# Patient Record
Sex: Female | Born: 1990 | Race: Black or African American | Hispanic: No | Marital: Single | Smoking: Former smoker
Health system: Southern US, Community
[De-identification: ages and names within clinical notes are randomized; demographics above are authoritative.]

## PROBLEM LIST (undated history)

## (undated) DIAGNOSIS — G40909 Epilepsy, unspecified, not intractable, without status epilepticus: Secondary | ICD-10-CM

## (undated) HISTORY — PX: APPENDECTOMY: SHX54

---

## 2014-10-10 ENCOUNTER — Encounter (HOSPITAL_COMMUNITY): Payer: Self-pay | Admitting: Emergency Medicine

## 2014-10-10 ENCOUNTER — Emergency Department (HOSPITAL_COMMUNITY)
Admission: EM | Admit: 2014-10-10 | Discharge: 2014-10-10 | Disposition: A | Discharge status: Home / Self Care | Payer: Self-pay | Attending: Emergency Medicine | Admitting: Emergency Medicine

## 2014-10-10 DIAGNOSIS — Z8669 Personal history of other diseases of the nervous system and sense organs: Secondary | ICD-10-CM | POA: Insufficient documentation

## 2014-10-10 DIAGNOSIS — Y998 Other external cause status: Secondary | ICD-10-CM | POA: Insufficient documentation

## 2014-10-10 DIAGNOSIS — Y9389 Activity, other specified: Secondary | ICD-10-CM | POA: Insufficient documentation

## 2014-10-10 DIAGNOSIS — S3992XA Unspecified injury of lower back, initial encounter: Secondary | ICD-10-CM | POA: Insufficient documentation

## 2014-10-10 DIAGNOSIS — Z3202 Encounter for pregnancy test, result negative: Secondary | ICD-10-CM | POA: Insufficient documentation

## 2014-10-10 DIAGNOSIS — Y92410 Unspecified street and highway as the place of occurrence of the external cause: Secondary | ICD-10-CM | POA: Insufficient documentation

## 2014-10-10 DIAGNOSIS — Z720 Tobacco use: Secondary | ICD-10-CM | POA: Insufficient documentation

## 2014-10-10 DIAGNOSIS — R55 Syncope and collapse: Secondary | ICD-10-CM | POA: Insufficient documentation

## 2014-10-10 HISTORY — DX: Epilepsy, unspecified, not intractable, without status epilepticus: G40.909

## 2014-10-10 LAB — CBC WITH DIFFERENTIAL/PLATELET
BASOS PCT: 0 % (ref 0–1)
Basophils Absolute: 0 10*3/uL (ref 0.0–0.1)
Eosinophils Absolute: 0 10*3/uL (ref 0.0–0.7)
Eosinophils Relative: 0 % (ref 0–5)
HCT: 37.1 % (ref 36.0–46.0)
HEMOGLOBIN: 12.6 g/dL (ref 12.0–15.0)
LYMPHS ABS: 2.5 10*3/uL (ref 0.7–4.0)
LYMPHS PCT: 38 % (ref 12–46)
MCH: 27.8 pg (ref 26.0–34.0)
MCHC: 34 g/dL (ref 30.0–36.0)
MCV: 81.9 fL (ref 78.0–100.0)
MONO ABS: 0.5 10*3/uL (ref 0.1–1.0)
MONOS PCT: 7 % (ref 3–12)
Neutro Abs: 3.5 10*3/uL (ref 1.7–7.7)
Neutrophils Relative %: 55 % (ref 43–77)
Platelets: 210 10*3/uL (ref 150–400)
RBC: 4.53 MIL/uL (ref 3.87–5.11)
RDW: 12.2 % (ref 11.5–15.5)
WBC: 6.4 10*3/uL (ref 4.0–10.5)

## 2014-10-10 LAB — BASIC METABOLIC PANEL
ANION GAP: 9 (ref 5–15)
BUN: 7 mg/dL (ref 6–23)
CALCIUM: 9.2 mg/dL (ref 8.4–10.5)
CO2: 23 mmol/L (ref 19–32)
Chloride: 109 mmol/L (ref 96–112)
Creatinine, Ser: 1.06 mg/dL (ref 0.50–1.10)
GFR calc Af Amer: 86 mL/min — ABNORMAL LOW (ref >90)
GFR, EST NON AFRICAN AMERICAN: 74 mL/min — AB (ref >90)
Glucose, Bld: 96 mg/dL (ref 70–99)
Potassium: 3.4 mmol/L — ABNORMAL LOW (ref 3.5–5.1)
Sodium: 141 mmol/L (ref 135–145)

## 2014-10-10 LAB — POC URINE PREG, ED: PREG TEST UR: NEGATIVE

## 2014-10-10 MED ORDER — SODIUM CHLORIDE 0.9 % IV BOLUS (SEPSIS)
1000.0000 mL | Freq: Once | INTRAVENOUS | Status: AC
  Administered 2014-10-10: 1000 mL via INTRAVENOUS

## 2014-10-10 MED ORDER — HYDROCODONE-ACETAMINOPHEN 5-325 MG PO TABS
1.0000 | ORAL_TABLET | Freq: Once | ORAL | Status: AC
  Administered 2014-10-10: 1 via ORAL
  Filled 2014-10-10: qty 1

## 2014-10-10 NOTE — Discharge Instructions (Signed)
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
## 2018-05-15 ENCOUNTER — Other Ambulatory Visit: Payer: Self-pay

## 2018-05-15 ENCOUNTER — Encounter (HOSPITAL_COMMUNITY): Payer: Self-pay

## 2018-05-15 DIAGNOSIS — F17210 Nicotine dependence, cigarettes, uncomplicated: Secondary | ICD-10-CM | POA: Insufficient documentation

## 2018-05-15 DIAGNOSIS — N3000 Acute cystitis without hematuria: Secondary | ICD-10-CM | POA: Insufficient documentation

## 2018-05-15 NOTE — ED Triage Notes (Signed)
Pt c/o pain and burning with urination x 1 week  with associated back and abdominal pain that started yesterday.

## 2018-05-16 ENCOUNTER — Emergency Department (HOSPITAL_COMMUNITY)
Admission: EM | Admit: 2018-05-16 | Discharge: 2018-05-16 | Disposition: A | Discharge status: Home / Self Care | Payer: Self-pay | Attending: Emergency Medicine | Admitting: Emergency Medicine

## 2018-05-16 DIAGNOSIS — R350 Frequency of micturition: Secondary | ICD-10-CM

## 2018-05-16 DIAGNOSIS — R300 Dysuria: Secondary | ICD-10-CM

## 2018-05-16 DIAGNOSIS — N3000 Acute cystitis without hematuria: Secondary | ICD-10-CM

## 2018-05-16 LAB — PREGNANCY, URINE: Preg Test, Ur: NEGATIVE

## 2018-05-16 LAB — URINALYSIS, ROUTINE W REFLEX MICROSCOPIC
Bilirubin Urine: NEGATIVE
Glucose, UA: NEGATIVE mg/dL
Ketones, ur: NEGATIVE mg/dL
NITRITE: POSITIVE — AB
Protein, ur: NEGATIVE mg/dL
Specific Gravity, Urine: 1.025 (ref 1.005–1.030)
pH: 5 (ref 5.0–8.0)

## 2018-05-16 MED ORDER — PHENAZOPYRIDINE HCL 100 MG PO TABS
100.0000 mg | ORAL_TABLET | Freq: Once | ORAL | Status: AC
  Administered 2018-05-16: 100 mg via ORAL
  Filled 2018-05-16: qty 1

## 2018-05-16 MED ORDER — CEPHALEXIN 500 MG PO CAPS
500.0000 mg | ORAL_CAPSULE | Freq: Once | ORAL | Status: AC
  Administered 2018-05-16: 500 mg via ORAL
  Filled 2018-05-16: qty 1

## 2018-05-16 MED ORDER — PHENAZOPYRIDINE HCL 200 MG PO TABS: 200.0000 mg | ORAL_TABLET | Freq: Three times a day (TID) | ORAL | 0 refills | Status: DC | PRN

## 2018-05-16 MED ORDER — CEPHALEXIN 500 MG PO CAPS: 500.0000 mg | ORAL_CAPSULE | Freq: Two times a day (BID) | ORAL | 0 refills | Status: DC

## 2018-05-16 NOTE — Discharge Instructions (Signed)
Take the prescribed medication as directed.  Make sure to drink plenty of water. Follow-up with your primary care doctor. Return to the ED for new or worsening symptoms. 

## 2018-05-16 NOTE — ED Provider Notes (Signed)
[facility]-EMERGENCY DEPT Provider Note   CSN: [id number] Arrival date & time: 05/15/18  2150     History   Chief Complaint Chief Complaint  Patient presents with  . Urinary Tract Infection    HPI Heather Warner is a 27 y.o. female.  The history is provided by the patient and medical records.     27 year old female with history of epilepsy, presenting to the ED with UTI symptoms.  States this actually began a week ago, initially was some mild dysuria and pressure when urinating but now starting to have some pain in her back and lower abdomen.  She reports continued dysuria and urinary frequency.  She denies any pelvic pain or vaginal discharge.  No fever or chills.  No nausea or vomiting.  States she has had UTIs in the past and this feels similar.  She has not tried any over-the-counter medications for her symptoms.  Past Medical History:  Diagnosis Date  . Epilepsy (HCC)     There are no active problems to display for this patient.   Past Surgical History:  Procedure Laterality Date  . APPENDECTOMY       OB History    Gravida  1   Para      Term      Preterm      AB  1   Living  0     SAB  1   TAB      Ectopic      Multiple      Live Births               Home Medications    Prior to Admission medications   Not on File    Family History History reviewed. No pertinent family history.  Social History Social History   Tobacco Use  . Smoking status: Current Every Day Smoker    Packs/day: 0.50    Types: Cigarettes  Substance Use Topics  . Alcohol use: Yes    Comment: occasionally  . Drug use: Yes    Types: Marijuana    Comment: occasionally     Allergies   Patient has no known allergies.   Review of Systems Review of Systems  Genitourinary: Positive for dysuria and frequency.  All other systems reviewed and are negative.    Physical Exam Updated Vital Signs BP 120/89 (BP Location: Left Arm)    Pulse (!) 55   Temp 98.1 F (36.7 C) (Oral)   Resp 18   Ht 5\' 4"  (1.626 m)   Wt 52.8 kg   LMP 05/08/2018   SpO2 100%   BMI 19.98 kg/m   Physical Exam  Constitutional: She is oriented to person, place, and time. She appears well-developed and well-nourished.  HENT:  Head: Normocephalic and atraumatic.  Mouth/Throat: Oropharynx is clear and moist.  Eyes: Pupils are equal, round, and reactive to light. Conjunctivae and EOM are normal.  Neck: Normal range of motion.  Cardiovascular: Normal rate, regular rhythm and normal heart sounds.  Pulmonary/Chest: Effort normal and breath sounds normal. No stridor. No respiratory distress.  Abdominal: Soft. Bowel sounds are normal. There is no tenderness. There is no rebound.  Musculoskeletal: Normal range of motion.  Neurological: She is alert and oriented to person, place, and time.  Skin: Skin is warm and dry.  Psychiatric: She has a normal mood and affect.  Nursing note and vitals reviewed.    ED Treatments / Results  Labs (all labs ordered are listed, but  only abnormal results are displayed) Labs Reviewed  URINALYSIS, ROUTINE W REFLEX MICROSCOPIC - Abnormal; Notable for the following components:      Result Value   Hgb urine dipstick SMALL (*)    Nitrite POSITIVE (*)    Leukocytes, UA TRACE (*)    Bacteria, UA RARE (*)    All other components within normal limits  PREGNANCY, URINE    EKG None  Radiology No results found.  Procedures Procedures (including critical care time)  Medications Ordered in ED Medications - No data to display   Initial Impression / Assessment and Plan / ED Course  I have reviewed the triage vital signs and the nursing notes.  Pertinent labs & imaging results that were available during my care of the patient were reviewed by me and considered in my medical decision making (see chart for details).  27 year old female here with urinary symptoms.  Has been ongoing for about 1 week.  She reports  dysuria and urinary frequency, now with some lower abdominal pain.  She is afebrile and nontoxic.  Abdomen is soft and benign.  No focal CVA tenderness.  UA appears infectious, nitrite positive.  Will treat with course of Keflex and Pyridium.  Encourage good oral hydration.  Close follow-up with PCP.  Return here for any new or worsening symptoms.  Final Clinical Impressions(s) / ED Diagnoses   Final diagnoses:  Acute cystitis without hematuria  Dysuria  Urinary frequency    ED Discharge Orders         Ordered    cephALEXin (KEFLEX) 500 MG capsule  2 times daily     05/16/18 0250    phenazopyridine (PYRIDIUM) 200 MG tablet  3 times daily PRN     05/16/18 0250           Garlon Hatchet, PA-C 05/16/18 2956    Zadie Rhine, MD 05/16/18 [phone]

## 2018-07-06 ENCOUNTER — Emergency Department (HOSPITAL_COMMUNITY)
Admission: EM | Admit: 2018-07-06 | Discharge: 2018-07-07 | Disposition: A | Discharge status: Home / Self Care | Payer: Self-pay | Attending: Emergency Medicine | Admitting: Emergency Medicine

## 2018-07-06 ENCOUNTER — Other Ambulatory Visit: Payer: Self-pay

## 2018-07-06 ENCOUNTER — Encounter (HOSPITAL_COMMUNITY): Payer: Self-pay

## 2018-07-06 DIAGNOSIS — F1910 Other psychoactive substance abuse, uncomplicated: Secondary | ICD-10-CM | POA: Insufficient documentation

## 2018-07-06 DIAGNOSIS — F17210 Nicotine dependence, cigarettes, uncomplicated: Secondary | ICD-10-CM | POA: Insufficient documentation

## 2018-07-06 DIAGNOSIS — Z79899 Other long term (current) drug therapy: Secondary | ICD-10-CM | POA: Insufficient documentation

## 2018-07-06 DIAGNOSIS — R569 Unspecified convulsions: Secondary | ICD-10-CM

## 2018-07-06 DIAGNOSIS — G40909 Epilepsy, unspecified, not intractable, without status epilepticus: Secondary | ICD-10-CM | POA: Insufficient documentation
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
## 2018-07-06 NOTE — ED Notes (Signed)
Bed: [id number] Expected date:  Expected time:  Means of arrival:  Comments: EMS 27 yo female from home-seizure type activity-leg weakness after seizure-now has a headache
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
## 2018-07-07 ENCOUNTER — Emergency Department (HOSPITAL_COMMUNITY): Payer: Self-pay

## 2018-07-07 LAB — URINALYSIS, ROUTINE W REFLEX MICROSCOPIC
Bilirubin Urine: NEGATIVE
Glucose, UA: NEGATIVE mg/dL
Ketones, ur: 5 mg/dL — AB
Leukocytes, UA: NEGATIVE
Nitrite: NEGATIVE
Protein, ur: 30 mg/dL — AB
SPECIFIC GRAVITY, URINE: 1.025 (ref 1.005–1.030)
pH: 6 (ref 5.0–8.0)

## 2018-07-07 LAB — COMPREHENSIVE METABOLIC PANEL
ALK PHOS: 38 U/L (ref 38–126)
ALT: 13 U/L (ref 0–44)
ANION GAP: 9 (ref 5–15)
AST: 20 U/L (ref 15–41)
Albumin: 3.6 g/dL (ref 3.5–5.0)
BILIRUBIN TOTAL: 0.5 mg/dL (ref 0.3–1.2)
BUN: 10 mg/dL (ref 6–20)
CO2: 23 mmol/L (ref 22–32)
Calcium: 8.9 mg/dL (ref 8.9–10.3)
Chloride: 107 mmol/L (ref 98–111)
Creatinine, Ser: 1.01 mg/dL — ABNORMAL HIGH (ref 0.44–1.00)
GFR calc Af Amer: >60 mL/min (ref >60)
Glucose, Bld: 84 mg/dL (ref 70–99)
Potassium: 3.3 mmol/L — ABNORMAL LOW (ref 3.5–5.1)
Sodium: 139 mmol/L (ref 135–145)
TOTAL PROTEIN: 6.4 g/dL — AB (ref 6.5–8.1)

## 2018-07-07 LAB — CBC WITH DIFFERENTIAL/PLATELET
Abs Immature Granulocytes: 0.04 10*3/uL (ref 0.00–0.07)
Basophils Absolute: 0 10*3/uL (ref 0.0–0.1)
Basophils Relative: 0 %
EOS ABS: 0.1 10*3/uL (ref 0.0–0.5)
EOS PCT: 1 %
HEMATOCRIT: 40.8 % (ref 36.0–46.0)
HEMOGLOBIN: 13.2 g/dL (ref 12.0–15.0)
Immature Granulocytes: 1 %
LYMPHS ABS: 1.6 10*3/uL (ref 0.7–4.0)
Lymphocytes Relative: 21 %
MCH: 28.7 pg (ref 26.0–34.0)
MCHC: 32.4 g/dL (ref 30.0–36.0)
MCV: 88.7 fL (ref 80.0–100.0)
MONO ABS: 0.4 10*3/uL (ref 0.1–1.0)
Monocytes Relative: 5 %
NEUTROS ABS: 5.3 10*3/uL (ref 1.7–7.7)
NEUTROS PCT: 72 %
NRBC: 0 % (ref 0.0–0.2)
Platelets: 231 10*3/uL (ref 150–400)
RBC: 4.6 MIL/uL (ref 3.87–5.11)
RDW: 11.9 % (ref 11.5–15.5)
WBC: 7.5 10*3/uL (ref 4.0–10.5)

## 2018-07-07 LAB — I-STAT BETA HCG BLOOD, ED (MC, WL, AP ONLY)

## 2018-07-07 LAB — RAPID URINE DRUG SCREEN, HOSP PERFORMED
Amphetamines: NOT DETECTED
Barbiturates: NOT DETECTED
Benzodiazepines: NOT DETECTED
COCAINE: POSITIVE — AB
OPIATES: NOT DETECTED
TETRAHYDROCANNABINOL: POSITIVE — AB

## 2018-07-07 MED ORDER — ACETAMINOPHEN 500 MG PO TABS
1000.0000 mg | ORAL_TABLET | Freq: Once | ORAL | Status: AC
  Administered 2018-07-07: 1000 mg via ORAL
  Filled 2018-07-07: qty 2

## 2018-07-07 MED ORDER — LEVETIRACETAM 500 MG PO TABS: 500.0000 mg | ORAL_TABLET | Freq: Two times a day (BID) | ORAL | 1 refills | Status: DC

## 2018-07-07 MED ORDER — LEVETIRACETAM IN NACL 1000 MG/100ML IV SOLN
1000.0000 mg | Freq: Once | INTRAVENOUS | Status: AC
  Administered 2018-07-07: 1000 mg via INTRAVENOUS
  Filled 2018-07-07: qty 100

## 2018-07-07 NOTE — ED Notes (Signed)
Asked for urine  

## 2018-07-07 NOTE — ED Notes (Signed)
Pt ambulated to BR to attempt to give urine specimen.

## 2018-07-07 NOTE — Discharge Instructions (Addendum)
You should not drive until you have been seen and cleared by your neurologist.

## 2019-06-24 IMAGING — CT CT HEAD W/O CM
3 series · 16 of 45 positions shown, 19 images · non-contrast
Comparison: None.

CLINICAL DATA: Seizure, headache

EXAM:
CT HEAD WITHOUT CONTRAST
TECHNIQUE: Contiguous axial images were obtained from the base of the skull
through the vertex without intravenous contrast.

[Series 2: head wo · axial · 0.47mm/px · z∈[-159,-44]mm · 10 of 28 slices shown, 13 images]
[im 3/28  brain]
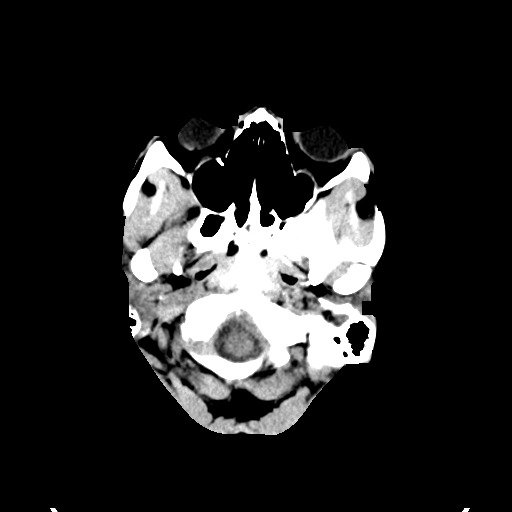
[im 3/28  bone]
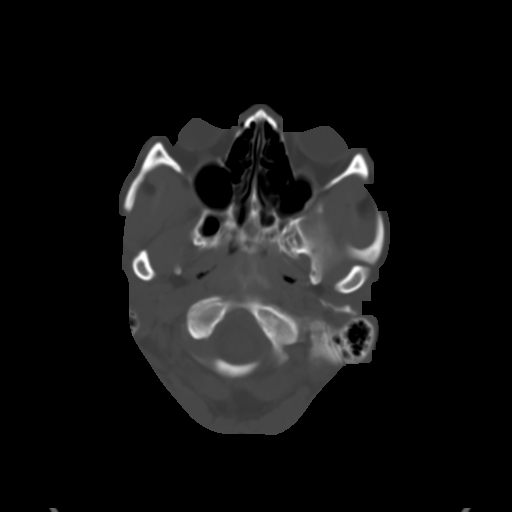
[im 5/28  brain]
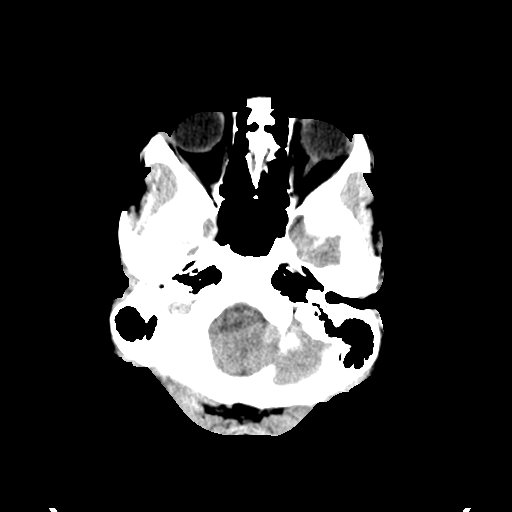
[im 8/28  brain]
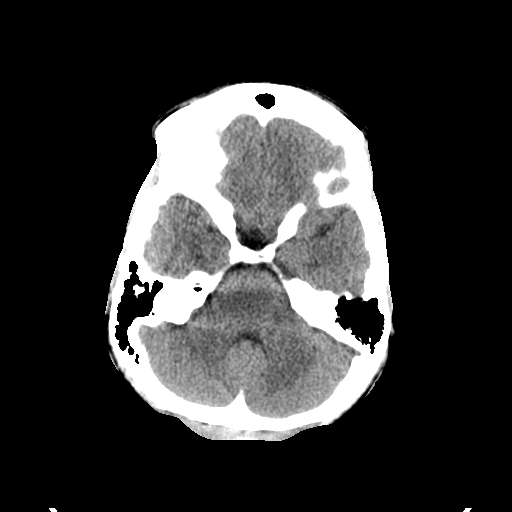
[im 11/28  brain]
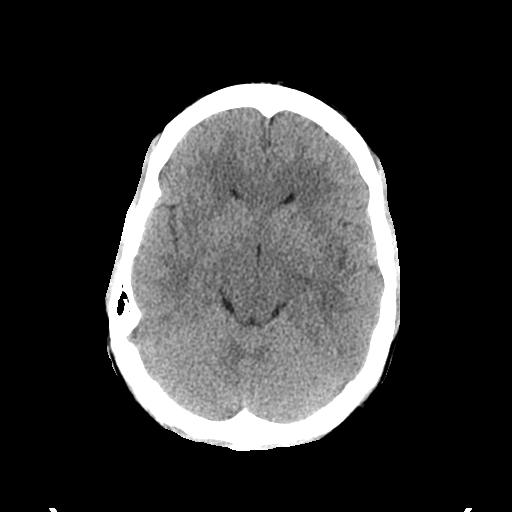
[im 13/28  brain]
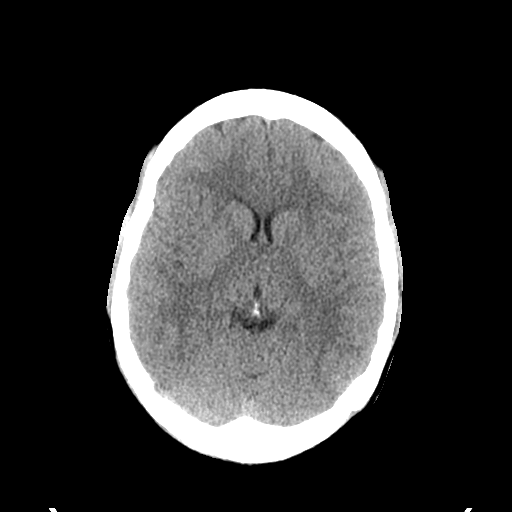
[im 13/28  bone]
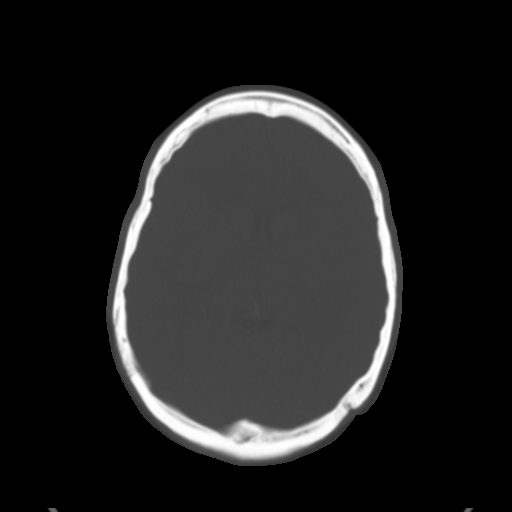
[im 16/28  brain]
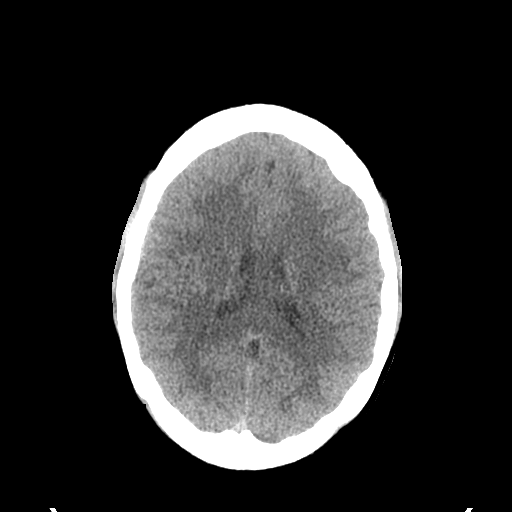
[im 18/28  brain]
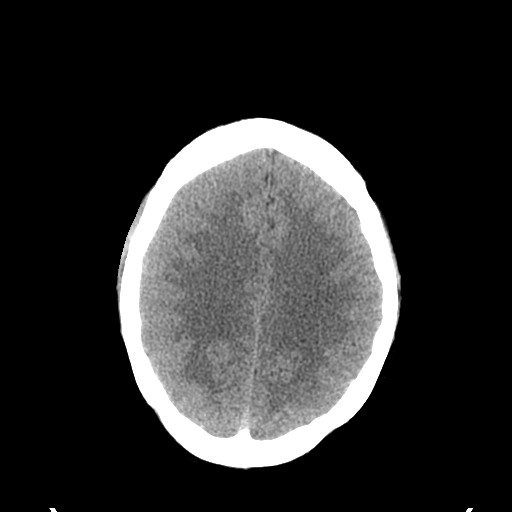
[im 21/28  brain]
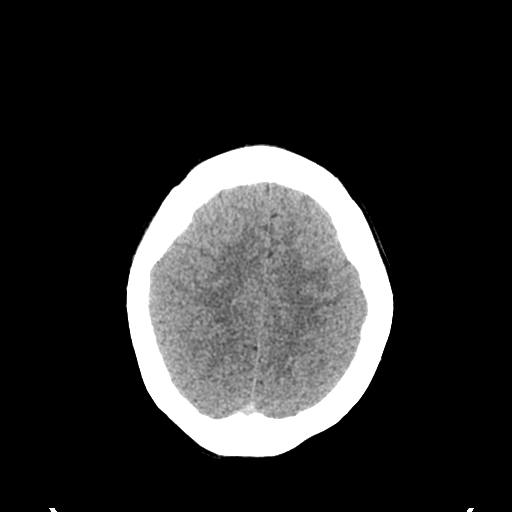
[im 24/28  brain]
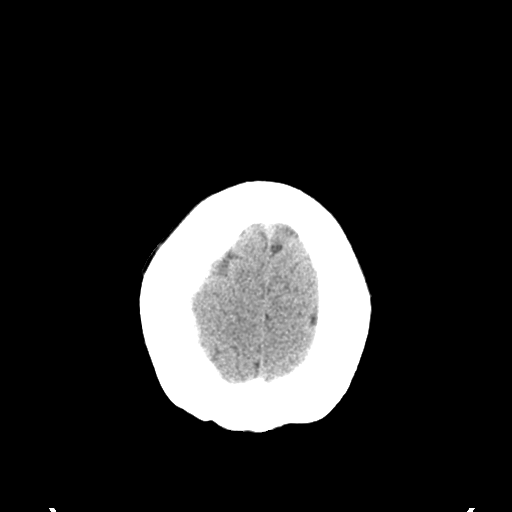
[im 24/28  bone]
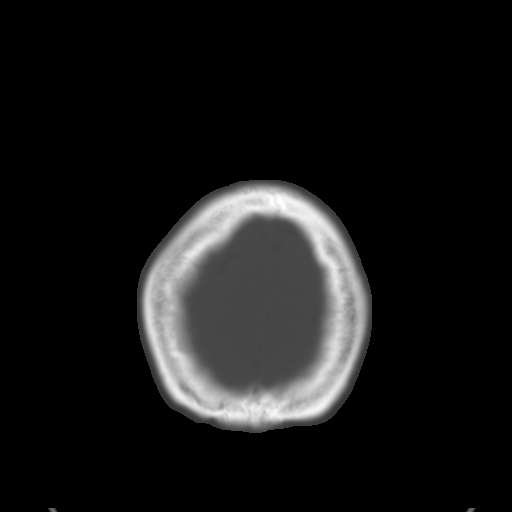
[im 26/28  brain]
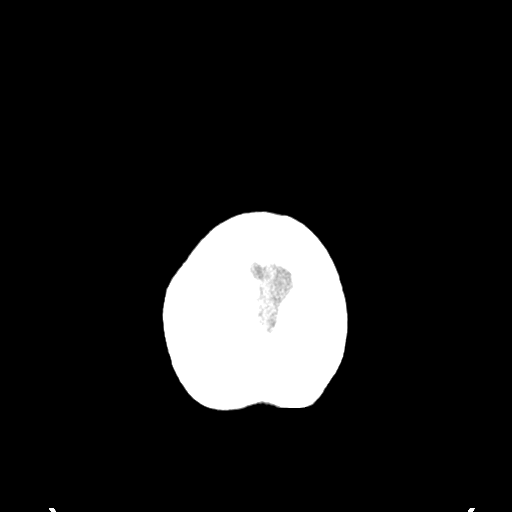

[Series 4: coronal soft tissue · coronal · 0.27mm/px · 3 of 61 slices shown]
[im 21/61  brain]
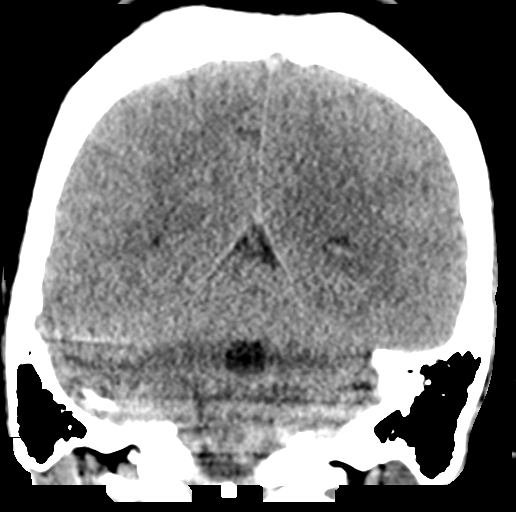
[im 27/61  brain]
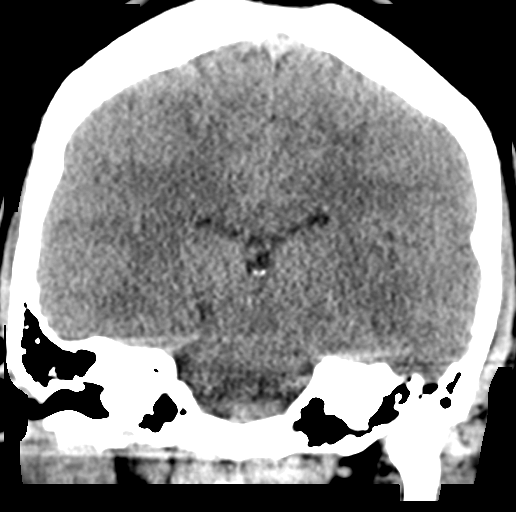
[im 34/61  brain]
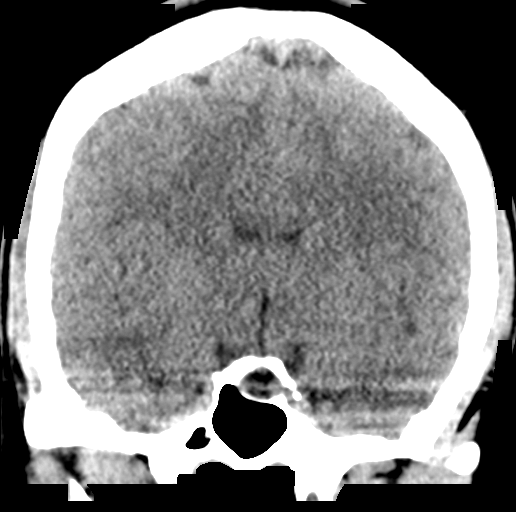

[Series 5: sagittal soft tissue · sagittal · 0.27mm/px · 3 of 47 slices shown]
[im 16/47  brain]
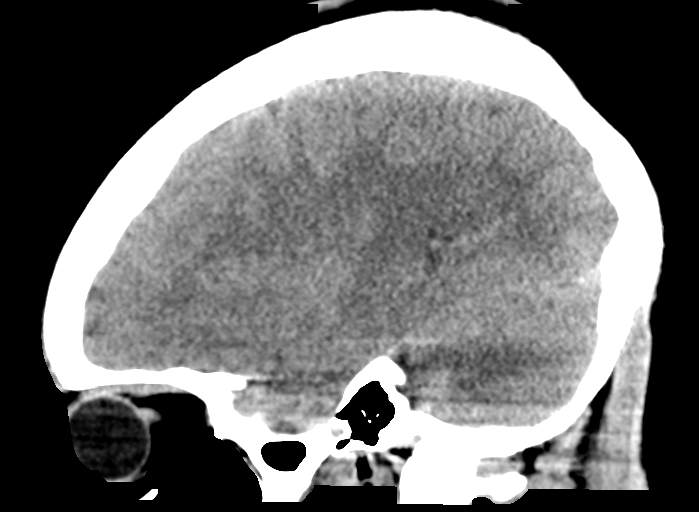
[im 24/47  brain]
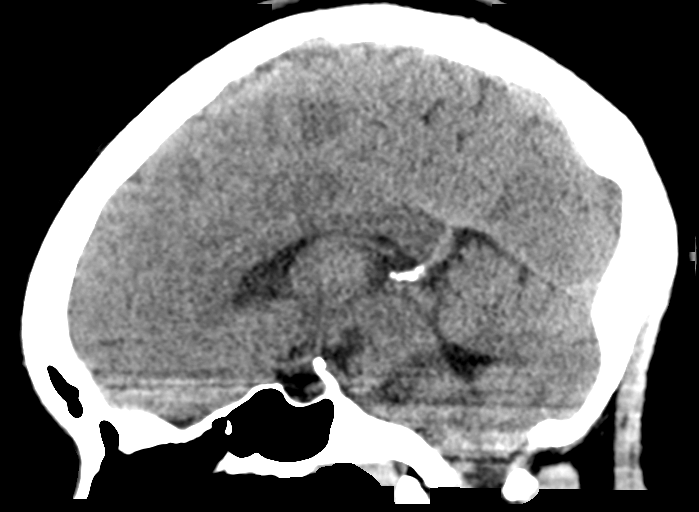
[im 31/47  brain]
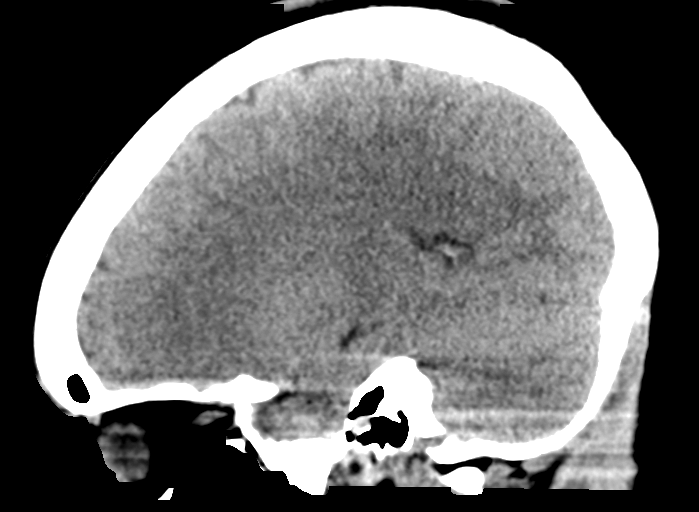

[16 of 45 positions shown; findings below may reference images not displayed]

FINDINGS: Brain: No acute intracranial abnormality. Specifically, no
hemorrhage, hydrocephalus, mass lesion, acute infarction, or
significant intracranial injury.

Vascular: No hyperdense vessel or unexpected calcification.

Skull: No acute calvarial abnormality.

Sinuses/Orbits: Visualized paranasal sinuses and mastoids clear.
Orbital soft tissues unremarkable.

Other: None
IMPRESSION: No acute intracranial abnormality.

## 2021-07-17 ENCOUNTER — Inpatient Hospital Stay (HOSPITAL_COMMUNITY): Payer: Self-pay

## 2021-07-17 ENCOUNTER — Encounter (HOSPITAL_COMMUNITY): Payer: Self-pay | Admitting: Obstetrics & Gynecology

## 2021-07-17 ENCOUNTER — Inpatient Hospital Stay (HOSPITAL_COMMUNITY)
Admission: AD | Admit: 2021-07-17 | Discharge: 2021-07-17 | Disposition: A | Discharge status: Home / Self Care | Payer: Self-pay | Attending: Obstetrics & Gynecology | Admitting: Obstetrics & Gynecology

## 2021-07-17 ENCOUNTER — Other Ambulatory Visit: Payer: Self-pay

## 2021-07-17 DIAGNOSIS — B9689 Other specified bacterial agents as the cause of diseases classified elsewhere: Secondary | ICD-10-CM | POA: Insufficient documentation

## 2021-07-17 DIAGNOSIS — R1013 Epigastric pain: Secondary | ICD-10-CM | POA: Insufficient documentation

## 2021-07-17 DIAGNOSIS — O99331 Smoking (tobacco) complicating pregnancy, first trimester: Secondary | ICD-10-CM | POA: Insufficient documentation

## 2021-07-17 DIAGNOSIS — Z3490 Encounter for supervision of normal pregnancy, unspecified, unspecified trimester: Secondary | ICD-10-CM

## 2021-07-17 DIAGNOSIS — O23591 Infection of other part of genital tract in pregnancy, first trimester: Secondary | ICD-10-CM | POA: Insufficient documentation

## 2021-07-17 DIAGNOSIS — O209 Hemorrhage in early pregnancy, unspecified: Secondary | ICD-10-CM

## 2021-07-17 DIAGNOSIS — N760 Acute vaginitis: Secondary | ICD-10-CM

## 2021-07-17 DIAGNOSIS — O26891 Other specified pregnancy related conditions, first trimester: Secondary | ICD-10-CM | POA: Insufficient documentation

## 2021-07-17 DIAGNOSIS — O26851 Spotting complicating pregnancy, first trimester: Secondary | ICD-10-CM | POA: Insufficient documentation

## 2021-07-17 DIAGNOSIS — F17210 Nicotine dependence, cigarettes, uncomplicated: Secondary | ICD-10-CM | POA: Insufficient documentation

## 2021-07-17 DIAGNOSIS — R102 Pelvic and perineal pain: Secondary | ICD-10-CM | POA: Insufficient documentation

## 2021-07-17 DIAGNOSIS — Z3A01 Less than 8 weeks gestation of pregnancy: Secondary | ICD-10-CM | POA: Insufficient documentation

## 2021-07-17 DIAGNOSIS — O219 Vomiting of pregnancy, unspecified: Secondary | ICD-10-CM | POA: Insufficient documentation

## 2021-07-17 LAB — URINALYSIS, MICROSCOPIC (REFLEX)

## 2021-07-17 LAB — URINALYSIS, ROUTINE W REFLEX MICROSCOPIC
Bilirubin Urine: NEGATIVE
Glucose, UA: NEGATIVE mg/dL
Hgb urine dipstick: NEGATIVE
Ketones, ur: NEGATIVE mg/dL
Nitrite: NEGATIVE
Protein, ur: NEGATIVE mg/dL
Specific Gravity, Urine: 1.025 (ref 1.005–1.030)
pH: 6.5 (ref 5.0–8.0)

## 2021-07-17 LAB — WET PREP, GENITAL
Sperm: NONE SEEN
Trich, Wet Prep: NONE SEEN
WBC, Wet Prep HPF POC: >=10 — AB (ref <10)
Yeast Wet Prep HPF POC: NONE SEEN

## 2021-07-17 LAB — HCG, QUANTITATIVE, PREGNANCY: hCG, Beta Chain, Quant, S: 159712 m[IU]/mL — ABNORMAL HIGH (ref <5)

## 2021-07-17 LAB — CBC
HCT: 36.1 % (ref 36.0–46.0)
Hemoglobin: 12.2 g/dL (ref 12.0–15.0)
MCH: 28.3 pg (ref 26.0–34.0)
MCHC: 33.8 g/dL (ref 30.0–36.0)
MCV: 83.8 fL (ref 80.0–100.0)
Platelets: 229 10*3/uL (ref 150–400)
RBC: 4.31 MIL/uL (ref 3.87–5.11)
RDW: 11.4 % — ABNORMAL LOW (ref 11.5–15.5)
WBC: 7.4 10*3/uL (ref 4.0–10.5)
nRBC: 0 % (ref 0.0–0.2)

## 2021-07-17 LAB — POCT PREGNANCY, URINE: Preg Test, Ur: POSITIVE — AB

## 2021-07-17 LAB — ABO/RH: ABO/RH(D): O POS

## 2021-07-17 MED ORDER — DOXYLAMINE SUCCINATE (SLEEP) 25 MG PO TABS: 25.0000 mg | ORAL_TABLET | Freq: Three times a day (TID) | ORAL | 0 refills | Status: DC | PRN

## 2021-07-17 MED ORDER — METRONIDAZOLE 500 MG PO TABS
500.0000 mg | ORAL_TABLET | Freq: Two times a day (BID) | ORAL | 0 refills | Status: DC
Start: 2021-07-17 — End: 2021-11-09

## 2021-07-17 MED ORDER — VITAMIN B-6 25 MG PO TABS: 25.0000 mg | ORAL_TABLET | Freq: Three times a day (TID) | ORAL | 0 refills | Status: AC | PRN

## 2021-07-17 MED ORDER — PROMETHAZINE HCL 12.5 MG PO TABS: 12.5000 mg | ORAL_TABLET | Freq: Four times a day (QID) | ORAL | 0 refills | Status: DC | PRN
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
## 2021-07-17 NOTE — Discharge Instructions (Signed)
Vomiting in First Trimester Follow these instructions at home: To help relieve your symptoms, listen to your body. Everyone is different and has different preferences. Find what works best for you. Here are some things you can try to help relieve your symptoms: Meals and snacks Eat 5-6 small meals daily instead of 3 large meals. Eating small meals and snacks can help you avoid an empty stomach. Before getting out of bed, eat a couple of crackers to avoid moving around on an empty stomach. Eat a protein-rich snack before bed. Examples include cheese and crackers, or a peanut butter sandwich made with 1 slice of whole-wheat bread and 1 tsp (5 g) of peanut butter. Eat and drink slowly. Try eating starchy foods as these are usually tolerated well. Examples include cereal, toast, bread, potatoes, pasta, rice, and pretzels. Eat at least one serving of protein with your meals and snacks. Protein options include lean meats, poultry, seafood, beans, nuts, nut butters, eggs, cheese, and yogurt. Eat or suck on things that have ginger in them. It may help to relieve nausea. Add  tsp (0.44 g) ground ginger to hot tea, or choose ginger tea.   Fluids It is important to stay hydrated. Try to: Drink small amounts of fluids often. Drink fluids 30 minutes before or after a meal to help lessen the feeling of a full stomach. Drink 100% fruit juice or an electrolyte drink. An electrolyte drink contains sodium, potassium, and chloride. Drink fluids that are cold, clear, and carbonated or sour. These include lemonade, ginger ale, lemon-lime soda, ice water, and sparkling water. Things to avoid Avoid the following: Eating foods that trigger your symptoms. These may include spicy foods, coffee, high-fat foods, very sweet foods, and acidic foods. Drinking more than 1 cup of fluid at a time. Skipping meals. Nausea can be more intense on an empty stomach. If you cannot tolerate food, do not force it. Try sucking on ice  chips or other frozen items and make up for missed calories later. Lying down within 2 hours after eating. Being exposed to environmental triggers. These may include food smells, smoky rooms, closed spaces, rooms with strong smells, warm or humid places, overly loud and noisy rooms, and rooms with motion or flickering lights. Try eating meals in a well-ventilated area that is free of strong smells. Making quick and sudden changes in your movement. Taking iron pills and multivitamins that contain iron. If you take prescription iron pills, do not stop taking them unless your health care provider approves. Preparing food. The smell of food can spoil your appetite or trigger nausea. General instructions Brush your teeth or use a mouth rinse after meals. Take over-the-counter and prescription medicines only as told by your health care provider. Follow instructions from your health care provider about eating or drinking restrictions. Talk with your health care provider about starting a supplement of vitamin B6. Continue to take your prenatal vitamins as told by your health care provider. If you are having trouble taking your prenatal vitamins, talk with your health care provider about other options. Keep all follow-up visits. This is important. Follow-up visits include prenatal visits. Contact a health care provider if: You have pain in your abdomen. You have a severe headache. You have vision problems. You are losing weight. You feel weak or dizzy. You cannot eat or drink without vomiting, especially if this goes on for a full day. Get help right away if: You cannot drink fluids without vomiting. You vomit blood. You have constant   nausea and vomiting. You are very weak. You faint. You have a fever and your symptoms suddenly get worse. Summary Making some changes to your eating habits may help relieve nausea and vomiting. This condition may be managed with lifestyle changes and medicines as  prescribed by your health care provider. If medicines do not help relieve nausea and vomiting, you may need to receive fluids through an IV at the hospital. This information is not intended to replace advice given to you by your health care provider. Make sure you discuss any questions you have with your health care provider. Document Revised: 02/11/2020 Document Reviewed: 02/11/2020 Elsevier Patient Education  2021 Elsevier Inc.  Safe Medications in Pregnancy   Acne: Benzoyl Peroxide Salicylic Acid  Backache/Headache: Tylenol: 2 regular strength every 4 hours OR              2 Extra strength every 6 hours  Colds/Coughs/Allergies: Benadryl (alcohol free) 25 mg every 6 hours as needed Breath right strips Claritin Cepacol throat lozenges Chloraseptic throat spray Cold-Eeze- up to three times per day Cough drops, alcohol free Flonase (by prescription only) Guaifenesin Mucinex Robitussin DM (plain only, alcohol free) Saline nasal spray/drops Sudafed (pseudoephedrine) & Actifed ** use only after [redacted] weeks gestation and if you do not have high blood pressure Tylenol Vicks Vaporub Zinc lozenges Zyrtec   Constipation: Colace Ducolax suppositories Fleet enema Glycerin suppositories Metamucil Milk of magnesia Miralax Senokot Smooth move tea  Diarrhea: Kaopectate Imodium A-D  *NO pepto Bismol  Hemorrhoids: Anusol Anusol HC Preparation H Tucks  Indigestion: Tums Maalox Mylanta Zantac  Pepcid  Insomnia: Benadryl (alcohol free) 25mg every 6 hours as needed Tylenol PM Unisom, no Gelcaps  Leg Cramps: Tums MagGel  Nausea/Vomiting:  Bonine Dramamine Emetrol Ginger extract Sea bands Meclizine  Nausea medication to take during pregnancy:  Unisom (doxylamine succinate 25 mg tablets) Take one tablet daily at bedtime. If symptoms are not adequately controlled, the dose can be increased to a maximum recommended dose of two tablets daily (1/2 tablet in the  morning, 1/2 tablet mid-afternoon and one at bedtime). Vitamin B6 100mg tablets. Take one tablet twice a day (up to 200 mg per day).  Skin Rashes: Aveeno products Benadryl cream or 25mg every 6 hours as needed Calamine Lotion 1% cortisone cream  Yeast infection: Gyne-lotrimin 7 Monistat 7   **If taking multiple medications, please check labels to avoid duplicating the same active ingredients **take medication as directed on the label ** Do not exceed 4000 mg of tylenol in 24 hours **Do not take medications that contain aspirin or ibuprofen     

## 2021-07-19 LAB — CULTURE, OB URINE

## 2021-07-20 LAB — GC/CHLAMYDIA PROBE AMP (~~LOC~~) NOT AT ARMC
Chlamydia: NEGATIVE
Comment: NEGATIVE
Comment: NORMAL
Neisseria Gonorrhea: NEGATIVE

## 2021-08-02 NOTE — L&D Delivery Note (Addendum)
Delivery Note  Heather Warner is a 31 y.o. female G2P1011 with IUP at [redacted]w[redacted]d admitted for labor that started spontaneously.  She progressed without augmentation to complete and pushed to deliver.    At 1:29 PM a viable female was delivered via Vaginal, Spontaneous (Presentation: Left Occiput Anterior).  APGAR: 9, 9; weight 6 lb 11.6 oz (3050 g).   Placenta status: Spontaneous, Intact.  Cord: 3 vessels with the following complications: None.  Cord pH: not collected  Cord clamping delayed by several minutes then clamped by CNM and cut by FOB.  Placenta intact and spontaneous, bleeding minimal.  No laceration noted. Mom and baby stable prior to transfer to postpartum. She plans on breastfeeding and bottle feeding. She requests post placenta IUD for birth control which was performed. See separate procedure note  Anesthesia: Epidural Episiotomy: None Lacerations: None Suture Repair:  n/a Est. Blood Loss (mL): 310  Mom to postpartum.  Baby to Couplet care / Skin to Skin.  Sheppard Evens MD MPH OB Fellow, Faculty Practice  Heather Warner was present to proctor the delivery.  I was present for the entire delivery of baby and placenta and inspection of the perineum and agree with above.  Heather Warner, [place], [place] 02/28/2022 8:29 PM

## 2021-10-15 ENCOUNTER — Telehealth (INDEPENDENT_AMBULATORY_CARE_PROVIDER_SITE_OTHER): Payer: Medicaid Other | Admitting: *Deleted

## 2021-10-15 ENCOUNTER — Other Ambulatory Visit: Payer: Self-pay

## 2021-10-15 DIAGNOSIS — O0990 Supervision of high risk pregnancy, unspecified, unspecified trimester: Secondary | ICD-10-CM | POA: Insufficient documentation

## 2021-10-15 DIAGNOSIS — G40909 Epilepsy, unspecified, not intractable, without status epilepticus: Secondary | ICD-10-CM | POA: Insufficient documentation

## 2021-10-15 DIAGNOSIS — O09899 Supervision of other high risk pregnancies, unspecified trimester: Secondary | ICD-10-CM

## 2021-10-15 DIAGNOSIS — Z3A00 Weeks of gestation of pregnancy not specified: Secondary | ICD-10-CM

## 2021-10-15 MED ORDER — BLOOD PRESSURE KIT DEVI: 1.0000 | 0 refills | Status: DC | PRN

## 2021-10-15 MED ORDER — GOJJI WEIGHT SCALE MISC: 1.0000 | 0 refills | Status: DC | PRN

## 2021-10-15 NOTE — Progress Notes (Signed)
New OB Intake ? ?I connected with  Heather Warner on 10/15/21 at 10:15 AM EDT by MyChart Video Visit and verified that I am speaking with the correct person using two identifiers. Nurse is located at [facility] and pt is located at home. ? ?I discussed the limitations, risks, security and privacy concerns of performing an evaluation and management service by telephone and the availability of in person appointments. I also discussed with the patient that there may be a patient responsible charge related to this service. The patient expressed understanding and agreed to proceed. ? ?I explained I am completing New OB Intake today. We discussed her EDD of 03/02/22 that is based on LMP of 05/26/21. Pt is G2/P0010. I reviewed her allergies, medications, Medical/Surgical/OB history, and appropriate screenings. I informed her of [facility] services. Based on history, this is a complicated   pregnancy  with history of seizures. Taken off meds by doctor 2001 , no seizures since 2001 or before then.   ? ?Patient Active Problem List  ? Diagnosis Date Noted  ? Supervision of high risk pregnancy, antepartum 10/15/2021  ? Epilepsy (HCC)   ? ? ?Concerns addressed today ? ?Delivery Plans:  ?Plans to deliver at [facility].  ? ?MyChart/Babyscripts ?MyChart access verified. I explained pt will have some visits in office and some virtually. Babyscripts instructions given and order placed.  ? ?Blood Pressure Cuff  ?Blood pressure cuff ordered for patient to pick-up from [employer]. Explained after first prenatal appt pt will check weekly and document in Babyscripts. ? ?Weight scale: Patient does not  have weight scale. Weight scale ordered for patient to pick up from [employer].  ? ?Anatomy US ?Explained first scheduled [place] will be for first available appointment. Anatomy US scheduled for 11/11/21 at 100. Pt notified to arrive at 1245. ?May have  afp if desired with new ob visit.   ? ?Labs ?Discussed Avel[facility] Laine genetic screening with  patient.Unsure if she  Would like both [facility] and [facility]- will discuss with partner. Informed her if she elects genetic testing  she will need AFP 15-21 weeks to complete genetic testing and can be done at new ob. .Routine prenatal labs needed. ? ?Covid Vaccine ?Patient has had covid vaccine.  ? ?Is patient a CenteringPregnancy candidate? Accepted  "Centering Patient" indicated on sticky note- informed her I will verify her hx seizures does not exclude her.  ?  ?Is patient a Mom+Baby Combined Care candidate? Declined    ? ?Is patient interested in [place]? Yes  "Interested in [facility] - Schedule next visit with CNM" on sticky note ? ?Informed patient of [facility] website  and placed link in her AVS.  ? ?Social Determinants of Health ?Food Insecurity: Patient denies food insecurity. ?WIC Referral: Patient is interested in referral to [facility].  ?Transportation: Patient denies transportation needs. ?Childcare: Discussed no children allowed at ultrasound appointments. Offered childcare services; patient declines childcare services at this time. ? ?Send link to Pregnancy Navigators ? ? ?Placed OB Box on problem list and updated ? ?First visit review ?I reviewed new OB appt with pt. I explained she will have a pelvic exam, ob bloodwork with genetic screening, and PAP smear. Explained pt will be seen by Dr. Debroah Loop at first visit; encounter routed to appropriate provider. Explained that patient will be seen by pregnancy navigator following visit with provider. [facility] information placed in AVS.  ? Nancy Fetter ?10/15/2021  11:07 AM  ?

## 2021-10-15 NOTE — Patient Instructions (Addendum)
?At our [facility], we work as an integrated team, providing care to address both physical and emotional health. Your medical provider may refer you to see our Behavioral Health Clinician (BHC) on the same day you see your medical provider, as availability permits; often scheduled virtually at your convenience.  ?Our BHC is available to all patients, visits generally last between 20-30 minutes, but can be longer or shorter, depending on patient need. The BHC offers help with stress management, coping with symptoms of depression and anxiety, major life changes , sleep issues, changing risky behavior, grief and loss, life stress, working on personal life goals, and  behavioral health issues, as these all affect your overall health and wellness.  ?The BHC is NOT available for the following: FMLA paperwork, court-ordered evaluations, specialty assessments (custody or disability), letters to employers, or obtaining certification for an emotional support animal. The BHC does not provide long-term therapy. ?You have the right to refuse integrated behavioral health services, or to reschedule to see the BHC at a later date.  ?Confidentiality exception: If it is suspected that a child or disabled adult is being abused or neglected, we are required by law to report that to either Child Protective Services or Adult Protective Services.  ?If you have a diagnosis of Bipolar affective disorder, Schizophrenia, or recurrent Major depressive disorder, we will recommend that you establish care with a psychiatrist, as these are lifelong, chronic conditions, and we want your overall emotional health and medications to be more closely monitored. If you anticipate needing extended maternity leave due to mental health issues postpartum, it it recommended you inform your medical provider, so we can put in a referral to a psychiatrist as soon as possible. The BHC is unable to recommend an extended maternity leave for mental  health issues. ?Your medical provider or BHC may refer you to a therapist for ongoing, traditional therapy, or to a psychiatrist, for medication management, if it would benefit your overall health. ?Depending on your insurance, you may have a copay or be charged a deductible, depending on your insurance, to see the BHC. If you are uninsured, it is recommended that you apply for financial assistance. (Forms may be requested at the front desk for in-person visits, via MyChart, or request a form during a virtual visit).  ?If you see the BHC more than 6 times, you will have to complete a comprehensive clinical assessment interview with the BHC to resume integrated services.  ?For virtual visits with the BHC, you must be physically in the state of [place] at the time of the visit. For example, if you live in [place], you will have to do an in-person visit with the BHC, and your out-of-state insurance may not cover behavioral health services in [place]. If you are going out of the state or country for any reason, the BHC may see you virtually when you return to [place], but not while you are physically outside of [place].  ?  ? ?Here is a link to the Pregnancy Navigators . Please ?Fill out prior to your New OB appointment.  ? ?English Link: https://[email or web address].tfaforms.[email or web address]?[email or web address] ? ?Spanish Link: https://[place].tfaforms.net/287?[email or web address]  ? ?[email or web address] is a website with information to help you prepare for Labor and delivery, patient information, visitor information and more.   ? ?Thank you for your interest in CenteringPregnancy.I wanted to give you a little more information. ?We are starting a new way to provide prenatal care - a way where there is less waiting   and lots more fun and education. You?ll learn about things like common discomforts of pregnancy, infant care, breastfeeding, nutrition and what to expect in labor. It's called CenteringPregnancy. You will meet in  a group with other pregnant women due around the same time as you.? In Centering you will have individual time with the provider who will be in the room the whole time - so you will actually have much more time with your provider in Centering than in traditional prenatal care.? You will come directly into the Centering room and will not wait in the lobby so there is no wasted time.? You will have 2-hour visits every 4 weeks then every 2 weeks. You will know your Centering prenatal appointments in advance. In your last month of pregnancy, you may also come in for some individual visits. Additional appointments can be scheduled if you need more care. Studies have shown that CenteringPregnancy improves birth outcomes. We have seen especially big improvements in fewer Black women delivering babies who are too small or born too early.  ?There is a website that you can browse. It is [email or web address] , look for centeringpregnancy  ?

## 2021-10-19 ENCOUNTER — Encounter: Payer: Self-pay | Admitting: Advanced Practice Midwife

## 2021-10-19 DIAGNOSIS — F1490 Cocaine use, unspecified, uncomplicated: Secondary | ICD-10-CM | POA: Insufficient documentation

## 2021-10-19 HISTORY — DX: Cocaine use, unspecified, uncomplicated: F14.90

## 2021-10-28 ENCOUNTER — Ambulatory Visit (INDEPENDENT_AMBULATORY_CARE_PROVIDER_SITE_OTHER): Payer: Medicaid Other | Admitting: Obstetrics & Gynecology

## 2021-10-28 ENCOUNTER — Other Ambulatory Visit: Payer: Self-pay

## 2021-10-28 ENCOUNTER — Other Ambulatory Visit (HOSPITAL_COMMUNITY)
Admission: RE | Admit: 2021-10-28 | Discharge: 2021-10-28 | Disposition: A | Discharge status: Home / Self Care | Payer: Medicaid Other | Source: Ambulatory Visit | Attending: Obstetrics & Gynecology | Admitting: Obstetrics & Gynecology

## 2021-10-28 VITALS — BP 121/65 | HR 82 | Wt 128.0 lb

## 2021-10-28 DIAGNOSIS — O0990 Supervision of high risk pregnancy, unspecified, unspecified trimester: Secondary | ICD-10-CM | POA: Insufficient documentation

## 2021-10-28 DIAGNOSIS — G40909 Epilepsy, unspecified, not intractable, without status epilepticus: Secondary | ICD-10-CM

## 2021-10-28 NOTE — Progress Notes (Signed)
?  Subjective:  ? ? Heather Warner is a G2P0010 [redacted]w[redacted]d being seen today for her first obstetrical visit.  Her obstetrical history is significant for  late prenatal care . Patient does intend to breast feed. Pregnancy history fully reviewed. ? ?Patient reports no complaints. ? ?Vitals:  ? 10/28/21 1002  ?BP: 121/65  ?Pulse: 82  ?Weight: 128 lb (58.1 kg)  ? ? ?HISTORY: ?OB History  ?Gravida Para Term Preterm AB Living  ?2       1 0  ?SAB IAB Ectopic Multiple Live Births  ?1          ?  ?# Outcome Date GA Lbr Len/2nd Weight Sex Delivery Anes PTL Lv  ?2 Current           ?1 SAB 2021          ? ?Past Medical History:  ?Diagnosis Date  ?? Epilepsy (HCC)   ? ?Past Surgical History:  ?Procedure Laterality Date  ?? APPENDECTOMY  2005  ?? WISDOM TOOTH EXTRACTION Left 2013  ? ?No family history on file. ? ? ?Exam  ? ? ?Uterus:   22 week size  ?Pelvic Exam:   ? Perineum: No Hemorrhoids  ? Vulva: normal  ? Vagina:  normal mucosa  ? pH:    ? Cervix: no lesions  ? Adnexa: normal adnexa  ? Bony Pelvis: average  ?System: Breast:    ? Skin: normal coloration and turgor, no rashes ?  ? Neurologic: normal, normal mood  ? Extremities: normal strength, tone, and muscle mass  ? HEENT PERRLA  ? Mouth/Teeth mucous membranes moist, pharynx normal without lesions  ? Neck supple  ? Cardiovascular: regular rate and rhythm  ? Respiratory:  appears well, vitals normal, no respiratory distress, acyanotic, normal RR  ? Abdomen: Gravid   ? Urinary: urethral meatus normal  ? ? ?  ?Assessment:  ? ? Pregnancy: G2P0010 ?Patient Active Problem List  ? Diagnosis Date Noted  ?? Cocaine use 10/19/2021  ?? Supervision of high risk pregnancy, antepartum 10/15/2021  ?? Epilepsy (HCC)   ? ?  ? ?  ?Plan:  ? ?  ?Initial labs drawn. ?Prenatal vitamins. ?Problem list reviewed and updated. ?Genetic Screening discussed : ordered. ? Ultrasound discussed; fetal survey: ordered. ? Follow up in 4 weeks. ?50% of 30 min visit spent on counseling and coordination of care.   ?Patient is on no seizure medication and her condition is in remission  ? ?Scheryl Darter ?10/28/2021 ? ? ?

## 2021-10-29 LAB — CBC/D/PLT+RPR+RH+ABO+RUBIGG...
Antibody Screen: NEGATIVE
Basophils Absolute: 0 10*3/uL (ref 0.0–0.2)
Basos: 0 %
EOS (ABSOLUTE): 0 10*3/uL (ref 0.0–0.4)
Eos: 1 %
HCV Ab: NONREACTIVE
HIV Screen 4th Generation wRfx: NONREACTIVE
Hematocrit: 32.1 % — ABNORMAL LOW (ref 34.0–46.6)
Hemoglobin: 10.8 g/dL — ABNORMAL LOW (ref 11.1–15.9)
Hepatitis B Surface Ag: NEGATIVE
Immature Grans (Abs): 0 10*3/uL (ref 0.0–0.1)
Immature Granulocytes: 0 %
Lymphocytes Absolute: 1.3 10*3/uL (ref 0.7–3.1)
Lymphs: 18 %
MCH: 28.6 pg (ref 26.6–33.0)
MCHC: 33.6 g/dL (ref 31.5–35.7)
MCV: 85 fL (ref 79–97)
Monocytes Absolute: 0.4 10*3/uL (ref 0.1–0.9)
Monocytes: 6 %
Neutrophils Absolute: 5.4 10*3/uL (ref 1.4–7.0)
Neutrophils: 75 %
Platelets: 217 10*3/uL (ref 150–450)
RBC: 3.77 x10E6/uL (ref 3.77–5.28)
RDW: 14.2 % (ref 11.7–15.4)
RPR Ser Ql: NONREACTIVE
Rh Factor: POSITIVE
Rubella Antibodies, IGG: 4.63 index (ref >0.99)
WBC: 7.2 10*3/uL (ref 3.4–10.8)

## 2021-10-29 LAB — GC/CHLAMYDIA PROBE AMP (~~LOC~~) NOT AT ARMC
Chlamydia: NEGATIVE
Comment: NEGATIVE
Comment: NORMAL
Neisseria Gonorrhea: NEGATIVE

## 2021-10-29 LAB — HCV INTERPRETATION

## 2021-10-29 LAB — HEMOGLOBIN A1C
Est. average glucose Bld gHb Est-mCnc: 85 mg/dL
Hgb A1c MFr Bld: 4.6 % — ABNORMAL LOW (ref 4.8–5.6)

## 2021-10-30 LAB — URINE CULTURE, OB REFLEX

## 2021-10-30 LAB — CULTURE, OB URINE

## 2021-11-01 LAB — CYTOLOGY - PAP
Comment: NEGATIVE
Diagnosis: NEGATIVE
High risk HPV: NEGATIVE

## 2021-11-02 ENCOUNTER — Other Ambulatory Visit: Payer: Self-pay

## 2021-11-02 DIAGNOSIS — F419 Anxiety disorder, unspecified: Secondary | ICD-10-CM

## 2021-11-02 NOTE — BH Specialist Note (Signed)
Pt did not arrive to video visit and did not answer the phone; Left HIPPA-compliant message to call back Jakwan Sally from [facility] at  [phone] (Mckenlee Mangham's office).  ?; left MyChart message for patient.  ? ?

## 2021-11-03 LAB — AFP, SERUM, OPEN SPINA BIFIDA
AFP MoM: 0.6
AFP Value: 56.1 ng/mL
Gest. Age on Collection Date: 22.1 weeks
Maternal Age At EDD: 29 yr
OSBR Risk 1 IN: 10000
Test Results:: NEGATIVE
Weight: 128 [lb_av]

## 2021-11-09 ENCOUNTER — Ambulatory Visit (INDEPENDENT_AMBULATORY_CARE_PROVIDER_SITE_OTHER): Payer: Medicaid Other | Admitting: Advanced Practice Midwife

## 2021-11-09 ENCOUNTER — Encounter: Payer: Self-pay | Admitting: Advanced Practice Midwife

## 2021-11-09 VITALS — BP 106/75 | HR 89 | Wt 133.0 lb

## 2021-11-09 DIAGNOSIS — G40909 Epilepsy, unspecified, not intractable, without status epilepticus: Secondary | ICD-10-CM

## 2021-11-09 DIAGNOSIS — O0990 Supervision of high risk pregnancy, unspecified, unspecified trimester: Secondary | ICD-10-CM

## 2021-11-09 DIAGNOSIS — Z3A23 23 weeks gestation of pregnancy: Secondary | ICD-10-CM

## 2021-11-09 MED ORDER — BLOOD PRESSURE KIT DEVI: 1.0000 | 0 refills | Status: DC | PRN

## 2021-11-09 NOTE — Progress Notes (Signed)
? ? ? ? ? ?PRENATAL VISIT NOTE- Centering Pregnancy Cycle 2, Session # 3 ? ?Subjective:  ?Heather Warner is a 31 y.o. G2P0010 at [redacted]w[redacted]d being seen today for ongoing prenatal care through Centering Pregnancy.  She is currently monitored for the following issues for this high-risk pregnancy and has Supervision of high risk pregnancy, antepartum; Epilepsy ([place]); and Cocaine use on their problem list. ? ?Patient reports no complaints.  Contractions: Not present.  .  Movement: Present. Denies leaking of fluid/ROM.  ? ?The following portions of the patient's history were reviewed and updated as appropriate: allergies, current medications, past family history, past medical history, past social history, past surgical history and problem list. Problem list updated. ? ?Objective:  ? ?Vitals:  ? 11/09/21 1142  ?BP: 106/75  ?Pulse: 89  ?Weight: 133 lb (60.3 kg)  ? ? ? ?Fetal Status: Fetal Heart Rate (bpm): 153 Fundal Height: 23 cm Movement: Present    ? ?General:  Alert, oriented and cooperative. Patient is in no acute distress.  ?Skin: Skin is warm and dry. No rash noted.   ?Cardiovascular: Normal heart rate noted  ?Respiratory: Normal respiratory effort, no problems with respiration noted  ?Abdomen: Soft, gravid, appropriate for gestational age.  Pain/Pressure: Present     ?Pelvic: Cervical exam deferred        ?Extremities: Normal range of motion.  Edema: None  ?Mental Status: Normal mood and affect. Normal behavior. Normal judgment and thought content.  ? ?Assessment and Plan:  ?Pregnancy: G2P0010 at [redacted]w[redacted]d ? ?1. Supervision of high risk pregnancy, antepartum ? ?Centering Pregnancy, Session#3: Reviewed resources in [employer].  ? ?Facilitated discussion today:  Stress/Stress reduction, nutrition in pregnancy, exercise, back pain in pregnancy, and breastfeeding/infant nutrition ?Mindfulness activity completed as well as deep breathing with still touch for childbirth preparation.   ? ?Fundal height and FHR appropriate  today unless noted otherwise in plan. Patient to continue group care.   ? ?2. Nonintractable epilepsy without status epilepticus, unspecified epilepsy type ([place]) ?--Discussed with pt, who expressed interest in waterbirth.  Doren Custard is for low risk patients, and seizure may risk her out of immersion in the tub, especially if it is not well controlled or is unpredictable.  Pt states understanding and is not sure she is interested in waterbirth at this time anyway.  She may want to take the class for more information, and is welcome to enroll. Will discuss with her at next Centering session and pursue records, medical review if she is still interested.  Discussed hydrotherapy, and other support of birth without medications including position changes, birth balls, etc and pt states understanding. ? ?3. [redacted] weeks gestation of pregnancy ? ? ? ?Preterm labor symptoms and general obstetric precautions including but not limited to vaginal bleeding, contractions, leaking of fluid and fetal movement were reviewed in detail with the patient. ?Please refer to After Visit Summary for other counseling recommendations.  ? ? ?Future Appointments  ?Date Time Provider [place]  ?11/11/2021 12:45 PM [facility] NURSE [facility]  ?11/11/2021  1:00 PM [facility] US1 [facility]  ?11/16/2021  2:15 PM [facility]-BEHAVIORAL HEALTH CLINICIAN [facility] [place]  ?12/07/2021  9:00 AM CENTERING PROVIDER [facility] [place]  ?12/21/2021  9:00 AM CENTERING PROVIDER [facility]  ?01/04/2022  9:00 AM CENTERING PROVIDER [facility] [place]  ?01/18/2022  9:00 AM CENTERING PROVIDER [facility] [place]  ?02/01/2022  9:00 AM CENTERING PROVIDER [facility] [place]  ?02/15/2022  9:00 AM CENTERING PROVIDER [facility] [place]  ?03/01/2022  9:00 AM CENTERING PROVIDER [facility] [place]  ? ? ?  Fatima Blank, CNM  ?

## 2021-11-11 ENCOUNTER — Ambulatory Visit: Payer: Medicaid Other | Admitting: *Deleted

## 2021-11-11 ENCOUNTER — Ambulatory Visit: Payer: Medicaid Other | Attending: Obstetrics & Gynecology

## 2021-11-11 VITALS — BP 110/64 | HR 82

## 2021-11-11 DIAGNOSIS — O0990 Supervision of high risk pregnancy, unspecified, unspecified trimester: Secondary | ICD-10-CM | POA: Insufficient documentation

## 2021-11-16 ENCOUNTER — Ambulatory Visit: Payer: Medicaid Other | Admitting: Clinical

## 2021-11-16 DIAGNOSIS — Z91199 Patient's noncompliance with other medical treatment and regimen due to unspecified reason: Secondary | ICD-10-CM

## 2021-11-18 ENCOUNTER — Telehealth: Payer: Self-pay

## 2021-11-18 NOTE — Telephone Encounter (Signed)
Attempted to call pt @ [phone] unable to leave message due to VM box is full in regards to [place] not being able to reach pt.  Note placed on pt's appt note to call [facility].   ? ?Linley Moskal,RN  ?11/18/21 ? ?

## 2021-12-16 ENCOUNTER — Telehealth: Payer: Self-pay

## 2021-12-16 NOTE — Telephone Encounter (Signed)
Attempted to call pt and left message to please call the office in regards to missed Centering appt.  Left reminder message about upcoming appt for Centering.    Leonette Nutting  12/16/21

## 2021-12-21 ENCOUNTER — Ambulatory Visit (INDEPENDENT_AMBULATORY_CARE_PROVIDER_SITE_OTHER): Payer: Medicaid Other | Admitting: Advanced Practice Midwife

## 2021-12-21 ENCOUNTER — Encounter: Payer: Self-pay | Admitting: *Deleted

## 2021-12-21 VITALS — BP 114/74 | HR 66 | Wt 143.6 lb

## 2021-12-21 DIAGNOSIS — O0990 Supervision of high risk pregnancy, unspecified, unspecified trimester: Secondary | ICD-10-CM

## 2021-12-21 DIAGNOSIS — F419 Anxiety disorder, unspecified: Secondary | ICD-10-CM

## 2021-12-21 DIAGNOSIS — Z3A29 29 weeks gestation of pregnancy: Secondary | ICD-10-CM

## 2021-12-21 DIAGNOSIS — G40909 Epilepsy, unspecified, not intractable, without status epilepticus: Secondary | ICD-10-CM

## 2021-12-21 DIAGNOSIS — O26843 Uterine size-date discrepancy, third trimester: Secondary | ICD-10-CM

## 2021-12-21 DIAGNOSIS — F32A Depression, unspecified: Secondary | ICD-10-CM

## 2021-12-21 NOTE — Progress Notes (Signed)
       PRENATAL VISIT NOTE- Centering Pregnancy Cycle 2, Session # 5  Subjective:  Heather Warner is a 31 y.o. G2P0010 at [redacted]w[redacted]d being seen today for ongoing prenatal care through Centering Pregnancy.  She is currently monitored for the following issues for this high-risk pregnancy and has Supervision of high risk pregnancy, antepartum; Epilepsy (HCC); and Cocaine use on their problem list.  Patient reports no complaints.  Contractions: Not present.  .  Movement: Present. Denies leaking of fluid/ROM.   The following portions of the patient's history were reviewed and updated as appropriate: allergies, current medications, past family history, past medical history, past social history, past surgical history and problem list. Problem list updated.  Objective:  There were no vitals filed for this visit.  Fetal Status: Fetal Heart Rate (bpm): 142 Fundal Height: 28 cm Movement: Present     General:  Alert, oriented and cooperative. Patient is in no acute distress.  Skin: Skin is warm and dry. No rash noted.   Cardiovascular: Normal heart rate noted  Respiratory: Normal respiratory effort, no problems with respiration noted  Abdomen: Soft, gravid, appropriate for gestational age.  Pain/Pressure: Absent     Pelvic: Cervical exam deferred        Extremities: Normal range of motion.     Mental Status: Normal mood and affect. Normal behavior. Normal judgment and thought content.   Assessment and Plan:  Pregnancy: G2P0010 at [redacted]w[redacted]d  1. Supervision of high risk pregnancy, antepartum  Centering Pregnancy, Session#5: Reviewed resources in [employer].   Facilitated discussion today:  Review of previous topics including breastfeeding and nutrition.  Stages of Labor, Sign of labor, labor positions, coping strategies.  Reviewed deep relaxation breathing and breathing exercises for labor.    Fundal height and FHR appropriate today unless noted otherwise in plan. Patient to continue group  care.      - CBC; Future - Glucose Tolerance, 2 Hours w/1 Hour; Future - HIV Antibody (routine testing w rflx); Future - RPR; Future  2. Nonintractable epilepsy without status epilepticus, unspecified epilepsy type (HCC) --No medications, no recent seizures - [place] MFM OB FOLLOW UP; Future  3. Anxiety and depression --stable  4. [redacted] weeks gestation of pregnancy - [place] MFM OB FOLLOW UP; Future  5. Uterine size date discrepancy pregnancy, third trimester --FH slightly behind today, pt missed last visit but anatomy US 46%  --F/U US for growth at ~ 32 weeks   Preterm labor symptoms and general obstetric precautions including but not limited to vaginal bleeding, contractions, leaking of fluid and fetal movement were reviewed in detail with the patient. Please refer to After Visit Summary for other counseling recommendations.  No follow-ups on file.  Future Appointments  Date Time Provider Department Center  12/22/2021  8:50 AM [facility] LAB [facility]  01/04/2022  9:00 AM CENTERING PROVIDER [facility]  01/18/2022  9:00 AM CENTERING PROVIDER [facility]  02/01/2022  9:00 AM CENTERING PROVIDER [facility]  02/15/2022  9:00 AM CENTERING PROVIDER [facility]  03/01/2022  9:00 AM CENTERING PROVIDER [facility]    Sharen Counter, CNM

## 2021-12-22 ENCOUNTER — Other Ambulatory Visit: Payer: Medicaid Other

## 2021-12-22 DIAGNOSIS — O0990 Supervision of high risk pregnancy, unspecified, unspecified trimester: Secondary | ICD-10-CM

## 2021-12-23 ENCOUNTER — Ambulatory Visit: Payer: Medicaid Other | Attending: Advanced Practice Midwife

## 2021-12-23 ENCOUNTER — Ambulatory Visit: Payer: Medicaid Other | Admitting: *Deleted

## 2021-12-23 VITALS — BP 113/61 | HR 75

## 2021-12-23 DIAGNOSIS — O0990 Supervision of high risk pregnancy, unspecified, unspecified trimester: Secondary | ICD-10-CM

## 2021-12-23 DIAGNOSIS — G40909 Epilepsy, unspecified, not intractable, without status epilepticus: Secondary | ICD-10-CM | POA: Diagnosis present

## 2021-12-23 DIAGNOSIS — Z3A29 29 weeks gestation of pregnancy: Secondary | ICD-10-CM | POA: Diagnosis present

## 2021-12-23 LAB — CBC
Hematocrit: 33.7 % — ABNORMAL LOW (ref 34.0–46.6)
Hemoglobin: 11.6 g/dL (ref 11.1–15.9)
MCH: 29 pg (ref 26.6–33.0)
MCHC: 34.4 g/dL (ref 31.5–35.7)
MCV: 84 fL (ref 79–97)
Platelets: 195 10*3/uL (ref 150–450)
RBC: 4 x10E6/uL (ref 3.77–5.28)
RDW: 12.3 % (ref 11.7–15.4)
WBC: 6.6 10*3/uL (ref 3.4–10.8)

## 2021-12-23 LAB — GLUCOSE TOLERANCE, 2 HOURS W/ 1HR
Glucose, 1 hour: 101 mg/dL (ref 70–179)
Glucose, 2 hour: 80 mg/dL (ref 70–152)
Glucose, Fasting: 73 mg/dL (ref 70–91)

## 2021-12-23 LAB — RPR: RPR Ser Ql: NONREACTIVE

## 2021-12-23 LAB — HIV ANTIBODY (ROUTINE TESTING W REFLEX): HIV Screen 4th Generation wRfx: NONREACTIVE

## 2021-12-31 ENCOUNTER — Telehealth: Payer: Self-pay

## 2021-12-31 NOTE — Telephone Encounter (Signed)
Scheduled for Follow up ultrasound after patient left office on 12/23/21 - patient scheduled for follow up ultrasound appointments - 1 = 6/21 @ 3:15pm  2 = 6/26 @ 2:45pm Left a message for the patient to call our office to let us know which appointment she would rather have.

## 2022-01-01 ENCOUNTER — Other Ambulatory Visit: Payer: Self-pay | Admitting: *Deleted

## 2022-01-01 DIAGNOSIS — Z87898 Personal history of other specified conditions: Secondary | ICD-10-CM

## 2022-01-04 ENCOUNTER — Ambulatory Visit (INDEPENDENT_AMBULATORY_CARE_PROVIDER_SITE_OTHER): Payer: Medicaid Other | Admitting: Advanced Practice Midwife

## 2022-01-04 VITALS — BP 113/78 | HR 71 | Wt 143.0 lb

## 2022-01-04 DIAGNOSIS — O0990 Supervision of high risk pregnancy, unspecified, unspecified trimester: Secondary | ICD-10-CM

## 2022-01-04 DIAGNOSIS — Z3A31 31 weeks gestation of pregnancy: Secondary | ICD-10-CM

## 2022-01-04 DIAGNOSIS — O26843 Uterine size-date discrepancy, third trimester: Secondary | ICD-10-CM

## 2022-01-04 DIAGNOSIS — G40909 Epilepsy, unspecified, not intractable, without status epilepticus: Secondary | ICD-10-CM

## 2022-01-04 NOTE — Progress Notes (Signed)
       PRENATAL VISIT NOTE- Centering Pregnancy Cycle 2, Session # 6  Subjective:  Heather Warner is a 31 y.o. G2P0010 at [redacted]w[redacted]d being seen today for ongoing prenatal care through Centering Pregnancy.  She is currently monitored for the following issues for this high-risk pregnancy and has Supervision of high risk pregnancy, antepartum; Epilepsy ([place]); and Cocaine use on their problem list.  Patient reports no complaints.  Contractions: Not present.  .  Movement: Present. Denies leaking of fluid/ROM.   The following portions of the patient's history were reviewed and updated as appropriate: allergies, current medications, past family history, past medical history, past social history, past surgical history and problem list. Problem list updated.  Objective:   Vitals:   01/04/22 0926  BP: 113/78  Pulse: 71  Weight: 143 lb (64.9 kg)    Fetal Status: Fetal Heart Rate (bpm): 130 Fundal Height: 28 cm Movement: Present     General:  Alert, oriented and cooperative. Patient is in no acute distress.  Skin: Skin is warm and dry. No rash noted.   Cardiovascular: Normal heart rate noted  Respiratory: Normal respiratory effort, no problems with respiration noted  Abdomen: Soft, gravid, appropriate for gestational age.  Pain/Pressure: Absent     Pelvic: Cervical exam deferred        Extremities: Normal range of motion.     Mental Status: Normal mood and affect. Normal behavior. Normal judgment and thought content.   Assessment and Plan:  Pregnancy: G2P0010 at [redacted]w[redacted]d  1. Supervision of high risk pregnancy, antepartum  Centering Pregnancy, Session#6: Reviewed resources in [employer].  Facilitated discussion today: stages of labor, comfort measures, postpartum and newborn care, car seat/crib safety   Mindfulness activity with positive affirmations   Fundal height and FHR appropriate today unless noted otherwise in plan. Patient to continue group care.     2. Nonintractable  epilepsy without status epilepticus, unspecified epilepsy type ([place])   3. Uterine size date discrepancy pregnancy, third trimester --Size less than dates today at 28 cm --[place] on 12/23/21 with EFW 26% --Schedule follow up growth  - [place] MFM OB FOLLOW UP; Future  4. [redacted] weeks gestation of pregnancy  - [place] MFM OB FOLLOW UP; Future    Preterm labor symptoms and general obstetric precautions including but not limited to vaginal bleeding, contractions, leaking of fluid and fetal movement were reviewed in detail with the patient. Please refer to After Visit Summary for other counseling recommendations.  Return for Centering Sessions as scheduled.  Future Appointments  Date Time Provider [place]  01/18/2022  9:00 AM CENTERING PROVIDER [facility]  01/20/2022  3:15 PM [facility] NURSE [facility]  01/20/2022  3:30 PM [facility] US2 [facility]  01/25/2022  2:45 PM [facility] NURSE [facility]  01/25/2022  3:00 PM [facility] US1 [facility]  02/01/2022  9:00 AM CENTERING PROVIDER [facility]  02/15/2022  9:00 AM CENTERING PROVIDER [facility]  03/01/2022  9:00 AM CENTERING PROVIDER [facility]    Fatima Blank, CNM

## 2022-01-18 ENCOUNTER — Ambulatory Visit (INDEPENDENT_AMBULATORY_CARE_PROVIDER_SITE_OTHER): Payer: Medicaid Other | Admitting: Advanced Practice Midwife

## 2022-01-18 VITALS — BP 119/85 | HR 97 | Wt 146.0 lb

## 2022-01-18 DIAGNOSIS — Z3A33 33 weeks gestation of pregnancy: Secondary | ICD-10-CM

## 2022-01-18 DIAGNOSIS — Z8669 Personal history of other diseases of the nervous system and sense organs: Secondary | ICD-10-CM

## 2022-01-18 DIAGNOSIS — F32A Depression, unspecified: Secondary | ICD-10-CM

## 2022-01-18 DIAGNOSIS — F1911 Other psychoactive substance abuse, in remission: Secondary | ICD-10-CM

## 2022-01-18 DIAGNOSIS — F419 Anxiety disorder, unspecified: Secondary | ICD-10-CM

## 2022-01-18 DIAGNOSIS — O0990 Supervision of high risk pregnancy, unspecified, unspecified trimester: Secondary | ICD-10-CM

## 2022-01-18 NOTE — Progress Notes (Signed)
       PRENATAL VISIT NOTE- Centering Pregnancy Cycle 2, Session # 7  Subjective:  Heather Warner is a 31 y.o. G2P0010 at [redacted]w[redacted]d being seen today for ongoing prenatal care through Centering Pregnancy.  She is currently monitored for the following issues for this high-risk pregnancy and has Supervision of high risk pregnancy, antepartum; Epilepsy (HCC); and Cocaine use on their problem list.  Patient reports no complaints.  Contractions: Not present.  .  Movement: Present. Denies leaking of fluid/ROM.   The following portions of the patient's history were reviewed and updated as appropriate: allergies, current medications, past family history, past medical history, past social history, past surgical history and problem list. Problem list updated.  Objective:   Vitals:   01/18/22 0925  BP: 119/85  Pulse: 97  Weight: 146 lb (66.2 kg)    Fetal Status: Fetal Heart Rate (bpm): 128 Fundal Height: 30 cm Movement: Present     General:  Alert, oriented and cooperative. Patient is in no acute distress.  Skin: Skin is warm and dry. No rash noted.   Cardiovascular: Normal heart rate noted  Respiratory: Normal respiratory effort, no problems with respiration noted  Abdomen: Soft, gravid, appropriate for gestational age.  Pain/Pressure: Absent     Pelvic: Cervical exam deferred        Extremities: Normal range of motion.     Mental Status: Normal mood and affect. Normal behavior. Normal judgment and thought content.   Assessment and Plan:  Pregnancy: G2P0010 at [redacted]w[redacted]d  1. Supervision of high risk pregnancy, antepartum  Centering Pregnancy, Session#7: Reviewed resources in [employer].   Facilitated discussion today:  newborn safety, breastfeeding and postpartum mood changes Mindfulness activity with mindful listening  Fundal height and FHR appropriate today unless noted otherwise in plan. Patient to continue group care.    2. History of seizure disorder --Discuss follow up with  neurology with pt, see below for plan to have private discussion  3. Anxiety and depression   4. [redacted] weeks gestation of pregnancy   5. History of substance abuse (HCC) --Pt with positive UDS in 2019, when evaluated in ED for grand mal seizure.  Screening done at [facility] negative, no recent use, no use by family or partner. --Will discuss further in private visit or set aside time to speak privately in next Centering session    Preterm labor symptoms and general obstetric precautions including but not limited to vaginal bleeding, contractions, leaking of fluid and fetal movement were reviewed in detail with the patient. Please refer to After Visit Summary for other counseling recommendations.  Return in about 2 weeks (around 02/01/2022) for Centering Sessions as scheduled.  Future Appointments  Date Time Provider Department Center  01/20/2022  3:15 PM [facility] NURSE [facility]  01/20/2022  3:30 PM [facility] US2 [facility]  02/01/2022  9:00 AM CENTERING PROVIDER [facility]  02/15/2022  9:00 AM CENTERING PROVIDER [facility]  03/01/2022  9:00 AM CENTERING PROVIDER [facility]    Sharen Counter, CNM

## 2022-01-20 ENCOUNTER — Ambulatory Visit: Payer: Medicaid Other | Admitting: *Deleted

## 2022-01-20 ENCOUNTER — Ambulatory Visit: Payer: Medicaid Other | Attending: Obstetrics & Gynecology

## 2022-01-20 VITALS — BP 123/74 | HR 70

## 2022-01-20 DIAGNOSIS — D563 Thalassemia minor: Secondary | ICD-10-CM

## 2022-01-20 DIAGNOSIS — G40909 Epilepsy, unspecified, not intractable, without status epilepticus: Secondary | ICD-10-CM | POA: Diagnosis not present

## 2022-01-20 DIAGNOSIS — Z3A31 31 weeks gestation of pregnancy: Secondary | ICD-10-CM | POA: Diagnosis present

## 2022-01-20 DIAGNOSIS — Z3A34 34 weeks gestation of pregnancy: Secondary | ICD-10-CM

## 2022-01-20 DIAGNOSIS — O0990 Supervision of high risk pregnancy, unspecified, unspecified trimester: Secondary | ICD-10-CM

## 2022-01-20 DIAGNOSIS — O26843 Uterine size-date discrepancy, third trimester: Secondary | ICD-10-CM

## 2022-01-20 DIAGNOSIS — O99350 Diseases of the nervous system complicating pregnancy, unspecified trimester: Secondary | ICD-10-CM | POA: Diagnosis not present

## 2022-01-20 DIAGNOSIS — O0932 Supervision of pregnancy with insufficient antenatal care, second trimester: Secondary | ICD-10-CM

## 2022-01-20 DIAGNOSIS — O285 Abnormal chromosomal and genetic finding on antenatal screening of mother: Secondary | ICD-10-CM

## 2022-01-25 ENCOUNTER — Ambulatory Visit: Payer: Medicaid Other

## 2022-02-01 ENCOUNTER — Other Ambulatory Visit (HOSPITAL_COMMUNITY)
Admission: RE | Admit: 2022-02-01 | Discharge: 2022-02-01 | Disposition: A | Discharge status: Home / Self Care | Payer: Medicaid Other | Source: Ambulatory Visit | Attending: Advanced Practice Midwife | Admitting: Advanced Practice Midwife

## 2022-02-01 ENCOUNTER — Ambulatory Visit (INDEPENDENT_AMBULATORY_CARE_PROVIDER_SITE_OTHER): Payer: Medicaid Other | Admitting: Advanced Practice Midwife

## 2022-02-01 VITALS — BP 116/72 | HR 71 | Wt 149.0 lb

## 2022-02-01 DIAGNOSIS — Z23 Encounter for immunization: Secondary | ICD-10-CM | POA: Diagnosis not present

## 2022-02-01 DIAGNOSIS — O0990 Supervision of high risk pregnancy, unspecified, unspecified trimester: Secondary | ICD-10-CM | POA: Diagnosis present

## 2022-02-01 DIAGNOSIS — Z3A35 35 weeks gestation of pregnancy: Secondary | ICD-10-CM

## 2022-02-01 DIAGNOSIS — Z8669 Personal history of other diseases of the nervous system and sense organs: Secondary | ICD-10-CM

## 2022-02-01 DIAGNOSIS — F1911 Other psychoactive substance abuse, in remission: Secondary | ICD-10-CM
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
## 2022-02-03 LAB — GC/CHLAMYDIA PROBE AMP (~~LOC~~) NOT AT ARMC
Chlamydia: NEGATIVE
Comment: NEGATIVE
Comment: NORMAL
Neisseria Gonorrhea: NEGATIVE

## 2022-02-05 LAB — CULTURE, BETA STREP (GROUP B ONLY): Strep Gp B Culture: NEGATIVE

## 2022-02-15 ENCOUNTER — Ambulatory Visit (INDEPENDENT_AMBULATORY_CARE_PROVIDER_SITE_OTHER): Payer: Medicaid Other | Admitting: Advanced Practice Midwife

## 2022-02-15 VITALS — BP 124/84 | HR 89 | Wt 152.0 lb

## 2022-02-15 DIAGNOSIS — G40909 Epilepsy, unspecified, not intractable, without status epilepticus: Secondary | ICD-10-CM

## 2022-02-15 DIAGNOSIS — O0990 Supervision of high risk pregnancy, unspecified, unspecified trimester: Secondary | ICD-10-CM

## 2022-02-15 DIAGNOSIS — Z3A37 37 weeks gestation of pregnancy: Secondary | ICD-10-CM

## 2022-02-15 NOTE — Progress Notes (Signed)
       PRENATAL VISIT NOTE- Centering Pregnancy Cycle 2, Session # 9  Subjective:  Heather Warner is a 31 y.o. G2P0010 at [redacted]w[redacted]d being seen today for ongoing prenatal care through Centering Pregnancy.  She is currently monitored for the following issues for this high-risk pregnancy and has Supervision of high risk pregnancy, antepartum; Epilepsy (HCC); and Cocaine use on their problem list.  Patient reports occasional contractions and pelvic pressure when standing .  Contractions: Irritability.  .  Movement: Present. Denies leaking of fluid/ROM.   The following portions of the patient's history were reviewed and updated as appropriate: allergies, current medications, past family history, past medical history, past social history, past surgical history and problem list. Problem list updated.  Objective:   Vitals:   02/15/22 0919  BP: 124/84  Pulse: 89  Weight: 152 lb (68.9 kg)    Fetal Status: Fetal Heart Rate (bpm): 138 Fundal Height: 36 cm Movement: Present  Presentation: Vertex  General:  Alert, oriented and cooperative. Patient is in no acute distress.  Skin: Skin is warm and dry. No rash noted.   Cardiovascular: Normal heart rate noted  Respiratory: Normal respiratory effort, no problems with respiration noted  Abdomen: Soft, gravid, appropriate for gestational age.  Pain/Pressure: Present     Pelvic: Cervical exam performed Dilation: 1.5 Effacement (%): 50 Station: -3  Extremities: Normal range of motion.  Edema: Trace  Mental Status: Normal mood and affect. Normal behavior. Normal judgment and thought content.   Assessment and Plan:  Pregnancy: G2P0010 at [redacted]w[redacted]d  1. Supervision of high risk pregnancy, antepartum  Centering Pregnancy, Session#9: Reviewed resources in [employer].   Facilitated discussion today:  breastfeeding and labor review  with facilitated discussion. Mindfulness activity.  Fundal height and FHR appropriate today unless noted otherwise in  plan. Patient to continue group care.   - Culture, beta strep (group b only) - Cervicovaginal ancillary only( [place])  --Pt requested cervical exam today, some watery discharge, wearing a pantyliner, not requiring a pad. If leaking increases, or would soak a pad or washcloth, pt to follow up with provider or go to MAU.   --Labor precautions reviewed  2. Nonintractable epilepsy without status epilepticus, unspecified epilepsy type (HCC) --Not on medications, last seizure 3 years ago  3. [redacted] weeks gestation of pregnancy      Term labor symptoms and general obstetric precautions including but not limited to vaginal bleeding, contractions, leaking of fluid and fetal movement were reviewed in detail with the patient. Please refer to After Visit Summary for other counseling recommendations.  Return in about 2 weeks (around 03/01/2022) for Centering Sessions as scheduled.  Future Appointments  Date Time Provider Department Center  03/01/2022  9:00 AM CENTERING PROVIDER [facility]  03/09/2022  8:15 AM Federico Flake, MD [facility]  03/16/2022  8:15 AM Marny Lowenstein, PA-C [facility]    Sharen Counter, CNM

## 2022-02-21 ENCOUNTER — Encounter (HOSPITAL_COMMUNITY): Payer: Self-pay | Admitting: Obstetrics & Gynecology

## 2022-02-21 ENCOUNTER — Inpatient Hospital Stay (HOSPITAL_COMMUNITY)
Admission: AD | Admit: 2022-02-21 | Discharge: 2022-02-21 | Disposition: A | Discharge status: Home / Self Care | Payer: Medicaid Other | Attending: Obstetrics & Gynecology | Admitting: Obstetrics & Gynecology

## 2022-02-21 DIAGNOSIS — O471 False labor at or after 37 completed weeks of gestation: Secondary | ICD-10-CM | POA: Insufficient documentation

## 2022-02-21 DIAGNOSIS — Z3A38 38 weeks gestation of pregnancy: Secondary | ICD-10-CM | POA: Diagnosis not present

## 2022-02-21 NOTE — MAU Note (Signed)
.  Heather Warner is a 31 y.o. at [redacted]w[redacted]d here in MAU reporting: ctx that has been on-going since 1630 and then got more intense around 2100 (8/10). Denies VB or LOF. Reports good FM.  Onset of complaint: today Pain score: 8/10 Vitals:   02/21/22 2123  BP: 119/76  Pulse: 60  Resp: 18  Temp: 98.2 F (36.8 C)  SpO2: 100%     FHT:153 Lab orders placed from triage:  mau labor

## 2022-02-21 NOTE — MAU Provider Note (Signed)
Heather Warner is a G2P0010 at [redacted]w[redacted]d seen in MAU for labor. RN labor check, not seen by provider. Patient reporting not feeling contractions much since arriving to MAU. SVE by RN Dilation: 1.5 Effacement (%): Thick Cervical Position: Posterior Station: -1 Presentation: Vertex Exam by:: Ginnie Smart RN   REACTIVE NST - FHR: 135 bpm / moderate variability / accels present / decels absent / TOCO: irregular contractions with UI noted  Plan:  D/C home with labor precautions Keep scheduled appt with [facility] on 03/01/2022  [name], CNM  02/21/2022 9:52 PM

## 2022-02-26 ENCOUNTER — Encounter (HOSPITAL_COMMUNITY): Payer: Self-pay | Admitting: Obstetrics and Gynecology

## 2022-02-26 ENCOUNTER — Inpatient Hospital Stay (HOSPITAL_COMMUNITY)
Admission: AD | Admit: 2022-02-26 | Discharge: 2022-02-26 | Disposition: A | Discharge status: Home / Self Care | Payer: Medicaid Other | Attending: Obstetrics and Gynecology | Admitting: Obstetrics and Gynecology

## 2022-02-26 DIAGNOSIS — Z3A39 39 weeks gestation of pregnancy: Secondary | ICD-10-CM | POA: Diagnosis not present

## 2022-02-26 DIAGNOSIS — O212 Late vomiting of pregnancy: Secondary | ICD-10-CM | POA: Insufficient documentation

## 2022-02-26 DIAGNOSIS — R112 Nausea with vomiting, unspecified: Secondary | ICD-10-CM

## 2022-02-26 DIAGNOSIS — O99891 Other specified diseases and conditions complicating pregnancy: Secondary | ICD-10-CM | POA: Insufficient documentation

## 2022-02-26 DIAGNOSIS — R042 Hemoptysis: Secondary | ICD-10-CM | POA: Diagnosis not present

## 2022-02-26 LAB — URINALYSIS, ROUTINE W REFLEX MICROSCOPIC
Bilirubin Urine: NEGATIVE
Glucose, UA: NEGATIVE mg/dL
Hgb urine dipstick: NEGATIVE
Ketones, ur: NEGATIVE mg/dL
Nitrite: NEGATIVE
Protein, ur: NEGATIVE mg/dL
Specific Gravity, Urine: 1.015 (ref 1.005–1.030)
pH: 6 (ref 5.0–8.0)

## 2022-02-26 MED ORDER — ONDANSETRON 4 MG PO TBDP
8.0000 mg | ORAL_TABLET | Freq: Once | ORAL | Status: AC
  Administered 2022-02-26: 8 mg via ORAL
  Filled 2022-02-26: qty 2

## 2022-02-26 MED ORDER — FAMOTIDINE 20 MG PO TABS
20.0000 mg | ORAL_TABLET | Freq: Once | ORAL | Status: AC
  Administered 2022-02-26: 20 mg via ORAL
  Filled 2022-02-26: qty 1

## 2022-02-26 NOTE — MAU Note (Signed)
Pt says has been N, V,  D - started at 0600. Diarrhea- loose stool x2  Vomited x4  today - - saw blood tinge vomit .  Feels some mild UC's  [facility]- clinic

## 2022-02-26 NOTE — MAU Provider Note (Signed)
History     CSN: [id number]  Arrival date and time: 02/26/22 0016   Event Date/Time   First Provider Initiated Contact with Patient 02/26/22 0103      Chief Complaint  Patient presents with   Emesis   Diarrhea   HPI  Heather Warner is a 31 y.o. female G2P0010 @ [redacted]w[redacted]d here in MAU with vomiting and spots of blood noted in her vomit. She has vomited 4x throughout the day, and has had 2 episodes of diarrhea/ loose stool. She has no sick contacts. She has not taken anything for the vomiting however would like some now. She has been able to keep down fluids and does not feel dehydrated.   OB History     Gravida  2   Para      Term      Preterm      AB  1   Living  0      SAB  1   IAB      Ectopic      Multiple      Live Births              Past Medical History:  Diagnosis Date   Epilepsy ([place])     Past Surgical History:  Procedure Laterality Date   APPENDECTOMY  2005   WISDOM TOOTH EXTRACTION Left 2013    Family History  Problem Relation Age of Onset   Alcohol abuse Maternal Grandfather    Hypertension Maternal Grandfather    Stroke Maternal Grandfather    Asthma Other    Diabetes Other     Social History   Tobacco Use   Smoking status: Former    Packs/day: 0.50    Types: Cigarettes    Quit date: 07/02/2021    Years since quitting: 0.6  Vaping Use   Vaping Use: Never used  Substance Use Topics   Alcohol use: Not Currently    Comment: occasionally   Drug use: Not Currently    Types: Marijuana    Comment: occasionally before pregnant    Allergies: No Known Allergies  Medications Prior to Admission  Medication Sig Dispense Refill Last Dose   Prenatal Vit-Fe Fumarate-FA (PRENATAL VITAMINS PO) Take 1 tablet by mouth daily. One a day prenatal   02/26/2022   Blood Pressure Monitoring (BLOOD PRESSURE KIT) DEVI 1 Device by Does not apply route as needed. (Patient not taking: Reported on 02/01/2022) 1 each 0    Misc. Devices (GOJJI WEIGHT  SCALE) MISC 1 Device by Does not apply route as needed. (Patient not taking: Reported on 01/18/2022) 1 each 0    No results found for this or any previous visit (from the past 48 hour(s)).   Review of Systems  Gastrointestinal:  Positive for diarrhea, nausea and vomiting. Negative for abdominal pain.   Physical Exam   Blood pressure 118/70, pulse 71, temperature 98.1 F (36.7 C), temperature source Oral, resp. rate 20, height $RemoveBe'5\' 4"'wZCPKQKwj$  (1.626 m), weight 69.4 kg, last menstrual period 05/26/2021.  Physical Exam Constitutional:      General: She is not in acute distress.    Appearance: Normal appearance. She is not ill-appearing, toxic-appearing or diaphoretic.  Eyes:     Pupils: Pupils are equal, round, and reactive to light.  Genitourinary:    Comments: Dilation: 1.5 Effacement (%): Thick Station: -1 Presentation: Vertex Exam by:: Ardelle Lesches, RN  Skin:    General: Skin is warm.  Neurological:     Mental Status: She is  alert and oriented to person, place, and time.  Psychiatric:        Behavior: Behavior normal.    Fetal Tracing: Baseline: 140 bpm Variability: Moderate  Accelerations: 15x15 Decelerations: None Toco: None  MAU Course  Procedures  MDM  Zofran and pepcid given PO Patient was given reassurance and would like to go home.   Assessment and Plan   A:  1. Hemoptysis   2. Nausea and vomiting, unspecified vomiting type   3. [redacted] weeks gestation of pregnancy     P:  Dc home Return to MAU if symptoms worsen Increase oral fluid intake.   Eleyna Brugh, Artist Pais, NP. Td 2:04 AM

## 2022-02-28 ENCOUNTER — Encounter (HOSPITAL_COMMUNITY): Payer: Self-pay | Admitting: Family Medicine

## 2022-02-28 ENCOUNTER — Inpatient Hospital Stay (HOSPITAL_COMMUNITY): Payer: Medicaid Other | Admitting: Anesthesiology

## 2022-02-28 ENCOUNTER — Inpatient Hospital Stay (HOSPITAL_COMMUNITY)
Admission: AD | Admit: 2022-02-28 | Discharge: 2022-03-02 | DRG: 807 | Disposition: A | Discharge status: Home / Self Care | Payer: Medicaid Other | Attending: Family Medicine | Admitting: Family Medicine

## 2022-02-28 ENCOUNTER — Other Ambulatory Visit: Payer: Self-pay

## 2022-02-28 DIAGNOSIS — G40909 Epilepsy, unspecified, not intractable, without status epilepticus: Secondary | ICD-10-CM

## 2022-02-28 DIAGNOSIS — Z3A39 39 weeks gestation of pregnancy: Secondary | ICD-10-CM

## 2022-02-28 DIAGNOSIS — O99324 Drug use complicating childbirth: Secondary | ICD-10-CM

## 2022-02-28 DIAGNOSIS — Z30430 Encounter for insertion of intrauterine contraceptive device: Secondary | ICD-10-CM | POA: Diagnosis not present

## 2022-02-28 DIAGNOSIS — Z30014 Encounter for initial prescription of intrauterine contraceptive device: Secondary | ICD-10-CM | POA: Diagnosis not present

## 2022-02-28 DIAGNOSIS — Z87891 Personal history of nicotine dependence: Secondary | ICD-10-CM | POA: Diagnosis not present

## 2022-02-28 DIAGNOSIS — O26893 Other specified pregnancy related conditions, third trimester: Secondary | ICD-10-CM | POA: Diagnosis present

## 2022-02-28 DIAGNOSIS — O0990 Supervision of high risk pregnancy, unspecified, unspecified trimester: Principal | ICD-10-CM

## 2022-02-28 DIAGNOSIS — O4202 Full-term premature rupture of membranes, onset of labor within 24 hours of rupture: Secondary | ICD-10-CM

## 2022-02-28 LAB — RPR: RPR Ser Ql: NONREACTIVE

## 2022-02-28 LAB — CBC
HCT: 36.3 % (ref 36.0–46.0)
Hemoglobin: 12.2 g/dL (ref 12.0–15.0)
MCH: 28.3 pg (ref 26.0–34.0)
MCHC: 33.6 g/dL (ref 30.0–36.0)
MCV: 84.2 fL (ref 80.0–100.0)
Platelets: 210 10*3/uL (ref 150–400)
RBC: 4.31 MIL/uL (ref 3.87–5.11)
RDW: 13.6 % (ref 11.5–15.5)
WBC: 8.8 10*3/uL (ref 4.0–10.5)
nRBC: 0 % (ref 0.0–0.2)

## 2022-02-28 LAB — TYPE AND SCREEN
ABO/RH(D): O POS
Antibody Screen: NEGATIVE

## 2022-02-28 MED ORDER — ZOLPIDEM TARTRATE 5 MG PO TABS: 5.0000 mg | ORAL_TABLET | Freq: Every evening | ORAL | Status: DC | PRN

## 2022-02-28 MED ORDER — TETANUS-DIPHTH-ACELL PERTUSSIS 5-2.5-18.5 LF-MCG/0.5 IM SUSY: 0.5000 mL | PREFILLED_SYRINGE | Freq: Once | INTRAMUSCULAR | Status: DC

## 2022-02-28 MED ORDER — ONDANSETRON HCL 4 MG/2ML IJ SOLN
4.0000 mg | Freq: Four times a day (QID) | INTRAMUSCULAR | Status: DC | PRN
Start: 2022-02-28 — End: 2022-02-28
  Administered 2022-02-28: 4 mg via INTRAVENOUS
  Filled 2022-02-28: qty 2

## 2022-02-28 MED ORDER — DIPHENHYDRAMINE HCL 25 MG PO CAPS: 25.0000 mg | ORAL_CAPSULE | Freq: Four times a day (QID) | ORAL | Status: DC | PRN

## 2022-02-28 MED ORDER — OXYTOCIN-SODIUM CHLORIDE 30-0.9 UT/500ML-% IV SOLN
2.5000 [IU]/h | INTRAVENOUS | Status: DC
Start: 2022-02-28 — End: 2022-02-28
  Filled 2022-02-28: qty 500

## 2022-02-28 MED ORDER — OXYCODONE-ACETAMINOPHEN 5-325 MG PO TABS: 2.0000 | ORAL_TABLET | ORAL | Status: DC | PRN

## 2022-02-28 MED ORDER — SOD CITRATE-CITRIC ACID 500-334 MG/5ML PO SOLN: 30.0000 mL | ORAL | Status: DC | PRN

## 2022-02-28 MED ORDER — SENNOSIDES-DOCUSATE SODIUM 8.6-50 MG PO TABS
2.0000 | ORAL_TABLET | ORAL | Status: DC
Start: 2022-02-28 — End: 2022-03-02
  Administered 2022-02-28 – 2022-03-02 (×3): 2 via ORAL
  Filled 2022-02-28 (×3): qty 2

## 2022-02-28 MED ORDER — OXYTOCIN BOLUS FROM INFUSION
333.0000 mL | Freq: Once | INTRAVENOUS | Status: AC
  Administered 2022-02-28: 333 mL via INTRAVENOUS

## 2022-02-28 MED ORDER — FENTANYL-BUPIVACAINE-NACL 0.5-0.125-0.9 MG/250ML-% EP SOLN
12.0000 mL/h | EPIDURAL | Status: DC | PRN
  Administered 2022-02-28: 12 mL/h via EPIDURAL
  Filled 2022-02-28: qty 250

## 2022-02-28 MED ORDER — MEASLES, MUMPS & RUBELLA VAC IJ SOLR: 0.5000 mL | Freq: Once | INTRAMUSCULAR | Status: DC

## 2022-02-28 MED ORDER — ACETAMINOPHEN 325 MG PO TABS
650.0000 mg | ORAL_TABLET | ORAL | Status: DC | PRN
  Administered 2022-03-01 – 2022-03-02 (×3): 650 mg via ORAL
  Filled 2022-02-28 (×3): qty 2

## 2022-02-28 MED ORDER — PHENYLEPHRINE 80 MCG/ML (10ML) SYRINGE FOR IV PUSH (FOR BLOOD PRESSURE SUPPORT)
80.0000 ug | PREFILLED_SYRINGE | INTRAVENOUS | Status: DC | PRN
  Filled 2022-02-28: qty 10

## 2022-02-28 MED ORDER — LACTATED RINGERS IV SOLN: 500.0000 mL | Freq: Once | INTRAVENOUS | Status: DC

## 2022-02-28 MED ORDER — SODIUM CHLORIDE 0.9% FLUSH: 3.0000 mL | INTRAVENOUS | Status: DC | PRN

## 2022-02-28 MED ORDER — SIMETHICONE 80 MG PO CHEW: 80.0000 mg | CHEWABLE_TABLET | ORAL | Status: DC | PRN

## 2022-02-28 MED ORDER — FLEET ENEMA 7-19 GM/118ML RE ENEM: 1.0000 | ENEMA | RECTAL | Status: DC | PRN

## 2022-02-28 MED ORDER — DIBUCAINE (PERIANAL) 1 % EX OINT: 1.0000 | TOPICAL_OINTMENT | CUTANEOUS | Status: DC | PRN

## 2022-02-28 MED ORDER — EPHEDRINE 5 MG/ML INJ: 10.0000 mg | INTRAVENOUS | Status: DC | PRN

## 2022-02-28 MED ORDER — ONDANSETRON HCL 4 MG PO TABS: 4.0000 mg | ORAL_TABLET | ORAL | Status: DC | PRN

## 2022-02-28 MED ORDER — LIDOCAINE HCL (PF) 1 % IJ SOLN: 30.0000 mL | INTRAMUSCULAR | Status: DC | PRN

## 2022-02-28 MED ORDER — ACETAMINOPHEN 325 MG PO TABS: 650.0000 mg | ORAL_TABLET | ORAL | Status: DC | PRN

## 2022-02-28 MED ORDER — IBUPROFEN 600 MG PO TABS
600.0000 mg | ORAL_TABLET | Freq: Four times a day (QID) | ORAL | Status: DC
  Administered 2022-02-28 – 2022-03-02 (×8): 600 mg via ORAL
  Filled 2022-02-28 (×7): qty 1

## 2022-02-28 MED ORDER — OXYCODONE HCL 5 MG PO TABS
5.0000 mg | ORAL_TABLET | ORAL | Status: DC | PRN
  Administered 2022-03-02 (×2): 5 mg via ORAL
  Filled 2022-02-28 (×2): qty 1

## 2022-02-28 MED ORDER — LIDOCAINE HCL (PF) 1 % IJ SOLN
INTRAMUSCULAR | Status: DC | PRN
  Administered 2022-02-28 (×2): 4 mL via EPIDURAL

## 2022-02-28 MED ORDER — BENZOCAINE-MENTHOL 20-0.5 % EX AERO
1.0000 | INHALATION_SPRAY | CUTANEOUS | Status: DC | PRN
  Administered 2022-02-28: 1 via TOPICAL
  Filled 2022-02-28: qty 56

## 2022-02-28 MED ORDER — LACTATED RINGERS IV SOLN: 500.0000 mL | INTRAVENOUS | Status: DC | PRN

## 2022-02-28 MED ORDER — WITCH HAZEL-GLYCERIN EX PADS: 1.0000 | MEDICATED_PAD | CUTANEOUS | Status: DC | PRN

## 2022-02-28 MED ORDER — SODIUM CHLORIDE 0.9% FLUSH: 3.0000 mL | Freq: Two times a day (BID) | INTRAVENOUS | Status: DC

## 2022-02-28 MED ORDER — COCONUT OIL OIL: 1.0000 | TOPICAL_OIL | Status: DC | PRN

## 2022-02-28 MED ORDER — LEVONORGESTREL 20 MCG/DAY IU IUD
1.0000 | INTRAUTERINE_SYSTEM | Freq: Once | INTRAUTERINE | Status: AC
  Administered 2022-02-28: 1 via INTRAUTERINE
  Filled 2022-02-28: qty 1

## 2022-02-28 MED ORDER — PRENATAL MULTIVITAMIN CH
1.0000 | ORAL_TABLET | Freq: Every day | ORAL | Status: DC
Start: 2022-03-01 — End: 2022-03-02
  Administered 2022-03-01 – 2022-03-02 (×2): 1 via ORAL
  Filled 2022-02-28 (×2): qty 1

## 2022-02-28 MED ORDER — LACTATED RINGERS IV SOLN: INTRAVENOUS | Status: DC

## 2022-02-28 MED ORDER — SODIUM CHLORIDE 0.9 % IV SOLN: INTRAVENOUS | Status: DC | PRN

## 2022-02-28 MED ORDER — ONDANSETRON HCL 4 MG/2ML IJ SOLN: 4.0000 mg | INTRAMUSCULAR | Status: DC | PRN

## 2022-02-28 MED ORDER — OXYCODONE-ACETAMINOPHEN 5-325 MG PO TABS: 1.0000 | ORAL_TABLET | ORAL | Status: DC | PRN

## 2022-02-28 MED ORDER — PHENYLEPHRINE 80 MCG/ML (10ML) SYRINGE FOR IV PUSH (FOR BLOOD PRESSURE SUPPORT): 80.0000 ug | PREFILLED_SYRINGE | INTRAVENOUS | Status: DC | PRN

## 2022-02-28 MED ORDER — DIPHENHYDRAMINE HCL 50 MG/ML IJ SOLN: 12.5000 mg | INTRAMUSCULAR | Status: DC | PRN

## 2022-02-28 NOTE — Discharge Summary (Signed)
Postpartum Discharge Summary     Patient Name: Heather Warner DOB: [birth year] MRN: [id number]  Date of admission: 02/28/2022 Delivery date:02/28/2022  Delivering provider: Stormy Card  Date of discharge: 03/02/2022  Admitting diagnosis: Normal labor [O80, Z37.9] Intrauterine pregnancy: [redacted]w[redacted]d     Secondary diagnosis:  Principal Problem:   Normal labor Active Problems:   SVD (spontaneous vaginal delivery)   Encounter for IUD insertion  Additional problems: previous history of substance use, not in this pregnancy Late to prenatal care @ 22 weeks. Seizure d/o no longer treated, no seizures in 2 years.     Discharge diagnosis: Term Pregnancy Delivered                                              Post partum procedures: none Augmentation:  none Complications: None  Hospital course: Onset of Labor With Vaginal Delivery      31 y.o. yo G2P1011 at [redacted]w[redacted]d was admitted in Active Labor on 02/28/2022. Patient had an uncomplicated labor course as follows:  Membrane Rupture Time/Date: 1:29 PM ,02/28/2022   Delivery Method:Vaginal, Spontaneous  Episiotomy: None  Lacerations:  None  Patient had an uncomplicated postpartum course.  She is ambulating, tolerating a regular diet, passing flatus, and urinating well. Patient is discharged home in stable condition on 03/02/22.  Newborn Data: Birth date:02/28/2022  Birth time:1:29 PM  Gender:Female  Living status:Living  Apgars:9 ,9  Weight:3050 g   Magnesium Sulfate received: No BMZ received: No Rhophylac:N/A MMR:N/A T-DaP:Given prenatally Flu: N/A Transfusion:No  Physical exam  Vitals:   03/01/22 0502 03/01/22 1405 03/01/22 2120 03/02/22 0520  BP: 115/78 110/77 115/78 130/84  Pulse: (!) 52 68 62 (!) 50  Resp: $Remo'16 16 18 18  'BgVvZ$ Temp: 98 F (36.7 C) 98.2 F (36.8 C) 98.4 F (36.9 C) 97.8 F (36.6 C)  TempSrc: Oral Oral Oral Oral  SpO2: 100% 100% 100% 100%  Weight:      Height:       General: alert, cooperative, and no  distress Lochia: appropriate Uterine Fundus: firm Incision: Healing well with no significant drainage DVT Evaluation: No evidence of DVT seen on physical exam. Labs: Lab Results  Component Value Date   WBC 8.8 02/28/2022   HGB 12.2 02/28/2022   HCT 36.3 02/28/2022   MCV 84.2 02/28/2022   PLT 210 02/28/2022      Latest Ref Rng & Units 07/06/2018   11:59 PM  CMP  Glucose 70 - 99 mg/dL 84   BUN 6 - 20 mg/dL 10   Creatinine 0.44 - 1.00 mg/dL 1.01   Sodium 135 - 145 mmol/L 139   Potassium 3.5 - 5.1 mmol/L 3.3   Chloride 98 - 111 mmol/L 107   CO2 22 - 32 mmol/L 23   Calcium 8.9 - 10.3 mg/dL 8.9   Total Protein 6.5 - 8.1 g/dL 6.4   Total Bilirubin 0.3 - 1.2 mg/dL 0.5   Alkaline Phos 38 - 126 U/L 38   AST 15 - 41 U/L 20   ALT 0 - 44 U/L 13    Edinburgh Score:    02/28/2022    3:10 PM  Edinburgh Postnatal Depression Scale Screening Tool  I have been able to laugh and see the funny side of things. 0  I have looked forward with enjoyment to things. 0  I have blamed myself unnecessarily when things went wrong.  1  I have been anxious or worried for no good reason. 1  I have felt scared or panicky for no good reason. 1  Things have been getting on top of me. 1  I have been so unhappy that I have had difficulty sleeping. 0  I have felt sad or miserable. 1  I have been so unhappy that I have been crying. 0  The thought of harming myself has occurred to me. 0  Edinburgh Postnatal Depression Scale Total 5     After visit meds:  Allergies as of 03/02/2022   No Known Allergies      Medication List     TAKE these medications    acetaminophen 325 MG tablet Commonly known as: Tylenol Take 2 tablets (650 mg total) by mouth every 4 (four) hours as needed (for pain scale < 4).   ibuprofen 600 MG tablet Commonly known as: ADVIL Take 1 tablet (600 mg total) by mouth every 6 (six) hours.   PRENATAL VITAMINS PO Take 1 tablet by mouth daily. One a day prenatal          Discharge home in stable condition Infant Feeding: Breast Infant Disposition:home with mother Discharge instruction: per After Visit Summary and Postpartum booklet. Activity: Advance as tolerated. Pelvic rest for 6 weeks.  Diet: routine diet Future Appointments: Future Appointments  Date Time Provider [place]  03/31/2022  2:15 PM Caren Macadam, MD [facility]   Follow up Visit:  This message was sent to   Please schedule this patient for a In person postpartum visit in 6 weeks with the following provider: Any provider. Additional Postpartum F/U: IUD string check   Low risk pregnancy complicated by:  none Delivery mode:  Vaginal, Spontaneous  Anticipated Birth Control:  PP IUD placed  Gerlene Fee, DO OB Fellow, [place] [facility] [place] 03/02/2022, 7:15 AM

## 2022-02-28 NOTE — Procedures (Signed)
  Post-Placental IUD Insertion Procedure Note  Patient identified, informed consent signed prior to delivery, signed copy in chart, time out was performed.    Vaginal, labial and perineal areas thoroughly inspected for lacerations. no laceration identified.   - Mirena IUD grasped between sterile gloved fingers. Sterile lubrication applied to sterile gloved hand for ease of insertion. Fundus identified through abdominal wall using non-insertion hand. IUD inserted to fundus with bimanual technique. IUD carefully released at the fundus and insertion hand gently removed from vagina.    Strings trimmed to the level of the introitus. Patient tolerated procedure well.  Lot # [id number] Expiration Date: 05/01/2024  Patient given post procedure instructions and IUD care card with expiration date.  Patient is asked to keep IUD strings tucked in her vagina until her postpartum follow up visit in 4-6 weeks. Patient advised to abstain from sexual intercourse and pulling on strings before her follow-up visit. Patient verbalized an understanding of the plan of care and agrees.

## 2022-02-28 NOTE — Anesthesia Procedure Notes (Signed)
Epidural Patient location during procedure: OB Start time: 02/28/2022 12:44 PM End time: 02/28/2022 12:51 PM  Staffing Anesthesiologist: Mal Amabile, MD Performed: anesthesiologist   Preanesthetic Checklist Completed: patient identified, IV checked, site marked, risks and benefits discussed, surgical consent, monitors and equipment checked, pre-op evaluation and timeout performed  Epidural Patient position: sitting Prep: DuraPrep and site prepped and draped Patient monitoring: continuous pulse ox and blood pressure Approach: midline Location: L3-L4 Injection technique: LOR air  Needle:  Needle type: Tuohy  Needle gauge: 17 G Needle length: 9 cm and 9 Needle insertion depth: 4 cm Catheter type: closed end flexible Catheter size: 19 Gauge Catheter at skin depth: 9 cm Test dose: negative and Other  Assessment Events: blood not aspirated, injection not painful, no injection resistance, no paresthesia and negative IV test  Additional Notes Patient identified. Risks and benefits discussed including failed block, incomplete  Pain control, post dural puncture headache, nerve damage, paralysis, blood pressure Changes, nausea, vomiting, reactions to medications-both toxic and allergic and post Partum back pain. All questions were answered. Patient expressed understanding and wished to proceed. Sterile technique was used throughout procedure. Epidural site was Dressed with sterile barrier dressing. No paresthesias, signs of intravascular injection Or signs of intrathecal spread were encountered.  Patient was more comfortable after the epidural was dosed. Please see RN's note for documentation of vital signs and FHR which are stable. Reason for block:procedure for pain

## 2022-02-28 NOTE — Lactation Note (Signed)
This note was copied from a baby's chart. Lactation Consultation Note  Patient Name: Heather Warner [id number] Date: 02/28/2022   Age:29 hours   LC Note:  Attempted to visit with family, however, visitors had just arrived and birth parents requested to have a consult later this evening.  Acknowledged her request.  Support person at bedside.   Maternal Data    Feeding    LATCH Score Latch: Grasps breast easily, tongue down, lips flanged, rhythmical sucking.  Audible Swallowing: A few with stimulation  Type of Nipple: Everted at rest and after stimulation  Comfort (Breast/Nipple): Soft / non-tender  Hold (Positioning): Assistance needed to correctly position infant at breast and maintain latch.  LATCH Score: 8   Lactation Tools Discussed/Used    Interventions Interventions: Assisted with latch  Discharge    Consult Status      Kushi Kun R [place] 02/28/2022, 6:26 PM

## 2022-02-28 NOTE — Lactation Note (Signed)
This note was copied from a baby's chart. Lactation Consultation Note  Patient Name: Heather Warner Today's Date: 02/28/2022 Reason for consult: L&D Initial assessment;Primapara;1st time breastfeeding;Term Age:29 hours   Initial L&D Consult:  Visited with family < 1 hour after birth Assisted to latch easily in the cross cradle hold.  Baby "[name]" actively sucking; mother pleased.  Reassured parents that lactation services will be available on the M/B unit.  Support person at bedside.   Maternal Data    Feeding Mother's Current Feeding Choice: Breast Milk  LATCH Score Latch: Grasps breast easily, tongue down, lips flanged, rhythmical sucking.  Audible Swallowing: None  Type of Nipple: Everted at rest and after stimulation  Comfort (Breast/Nipple): Soft / non-tender  Hold (Positioning): Assistance needed to correctly position infant at breast and maintain latch.  LATCH Score: 7   Lactation Tools Discussed/Used    Interventions Interventions: Assisted with latch;Skin to skin  Discharge    Consult Status Consult Status: Follow-up from L&D    Ashawn Rinehart R Jary Louvier 02/28/2022, 2:13 PM

## 2022-02-28 NOTE — H&P (Addendum)
OBSTETRIC ADMISSION HISTORY AND PHYSICAL  Heather Warner is a 31 y.o. female G2P0010 with IUP at 46w5dby LMP presenting for SOL/SROM. She reports +FMs, No LOF, no VB, no blurry vision, headaches or peripheral edema, and RUQ pain.  She plans on breast and bottle feeding. She request ppIUD for birth control. [facility]  Dating: By LMP --->  Estimated Date of Delivery: 03/02/22  Sono:    _0 , CWD, normal anatomy, cephalic presentation, anterior placental lie, 2096g, 15% EFW   [facility]Complications: [facility]; denies this pregnancy -Anxiety/depression  -S<D, resolved -Epilepsy, reported no seizures in 2 years. Was on keppra prior.   Past Medical History: Past Medical History:  Diagnosis Date   Epilepsy ([place]     Past Surgical History: Past Surgical History:  Procedure Laterality Date   APPENDECTOMY  2005   WISDOM TOOTH EXTRACTION Left 2013    Obstetrical History: OB History     Gravida  2   Para      Term      Preterm      AB  1   Living  0      SAB  1   IAB      Ectopic      Multiple      Live Births              Social History Social History   Socioeconomic History   Marital status: Single    Spouse name: Not on file   Number of children: Not on file   Years of education: Not on file   Highest education level: Not on file  Occupational History   Not on file  Tobacco Use   Smoking status: Former    Packs/day: 0.50    Types: Cigarettes    Quit date: 07/02/2021    Years since quitting: 0.6   Smokeless tobacco: Not on file  Vaping Use   Vaping Use: Never used  Substance and Sexual Activity   Alcohol use: Not Currently    Comment: occasionally   Drug use: Not Currently    Types: Marijuana    Comment: occasionally before pregnant   Sexual activity: Not Currently    Comment: stopped when found out pregnant + upt 07/02/21  Other Topics Concern   Not on file  Social History  Narrative   Not on file   Social Determinants of Health   Financial Resource Strain: Not on file  Food Insecurity: No Food Insecurity (12/21/2021)   Hunger Vital Sign    Worried About Running Out of Food in the Last Year: Never true    Ran Out of Food in the Last Year: Never true  Transportation Needs: No Transportation Needs (12/21/2021)   PRAPARE - THydrologist(Medical): No    Lack of Transportation (Non-Medical): No  Physical Activity: Not on file  Stress: Not on file  Social Connections: Not on file    Family History: Family History  Problem Relation Age of Onset   Alcohol abuse Maternal Grandfather    Hypertension Maternal Grandfather    Stroke Maternal Grandfather    Asthma Other    Diabetes Other     Allergies: No Known Allergies  Medications Prior to Admission  Medication Sig Dispense Refill Last Dose   Prenatal Vit-Fe Fumarate-FA (PRENATAL VITAMINS PO) Take 1 tablet by mouth daily. One a day prenatal   02/28/2022   Blood  Pressure Monitoring (BLOOD PRESSURE KIT) DEVI 1 Device by Does not apply route as needed. (Patient not taking: Reported on 02/01/2022) 1 each 0    Misc. Devices (GOJJI WEIGHT SCALE) MISC 1 Device by Does not apply route as needed. (Patient not taking: Reported on 01/18/2022) 1 each 0      Review of Systems   All systems reviewed and negative except as stated in HPI  Blood pressure 130/79, pulse 61, temperature 97.9 F (36.6 C), temperature source Oral, height _0  (1.626 m), weight 68.1 kg, last menstrual period 05/26/2021, SpO2 98 %. General appearance: alert and mild distress Lungs: clear to auscultation bilaterally Heart: regular rate noted Abdomen: soft, non-tender; bowel sounds normal Pelvic: deferred to nursing exam Extremities: Homans sign is negative, no sign of DVT Presentation:  cephalic on last US Fetal monitoringBaseline: 125 bpm, Variability: Good {> 6 bpm), Accelerations: Reactive, and Decelerations:  Early Uterine activityFrequency: Every 1-3 minutes Dilation: 8 Effacement (%): 90 Station: 0 Exam by:: elaina edwards rn   Prenatal labs: ABO, Rh: O/Positive/-- (03/29 1157) Antibody: Negative (03/29 1157) Rubella: 4.63 (03/29 1157) RPR: Non Reactive (05/23 0917)  HBsAg: Negative (03/29 1157)  HIV: Non Reactive (05/23 0917)  GBS: Negative/-- (07/03 1334)  1 hr Glucola wnl Genetic screening  wnl Anatomy US wnl  Prenatal Transfer Tool  Maternal Diabetes: No Genetic Screening: Normal Maternal Ultrasounds/Referrals: Normal Fetal Ultrasounds or other Referrals:  Referred to Materal Fetal Medicine  Maternal Substance Abuse:  No hx of SUD, not this pregnancy. Significant Maternal Medications:  None Significant Maternal Lab Results: Group B Strep negative  No results found for this or any previous visit (from the past 24 hour(s)).  Patient Active Problem List   Diagnosis Date Noted   Normal labor 02/28/2022   Cocaine use 10/19/2021   Supervision of high risk pregnancy, antepartum 10/15/2021   Epilepsy ([place])     Assessment/Plan:  Heather Warner is a 31 y.o. G2P0010 at 9w5dhere for SOL/SROM  #Labor: SROM possibly 12 hours. Manage expectantly. Monitor for development of chorio if prolonged ROM. Currently afebrile. #Pain: Nitrous Oxide for now. Planning for epidural, awaiting labs #FWB: Cat I #ID: GBS neg #MOF: Both #MOC:ppIUD #Circ:  N/A, girl   Milianna Ericsson Autry-Lott, DO  02/28/2022, 12:01 PM

## 2022-02-28 NOTE — Anesthesia Preprocedure Evaluation (Signed)
Anesthesia Evaluation  Patient identified by MRN, date of birth, ID band Patient awake    Reviewed: Allergy & Precautions, Patient's Chart, lab work & pertinent test results  Airway Mallampati: II       Dental no notable dental hx.    Pulmonary neg pulmonary ROS, former smoker,    Pulmonary exam normal        Cardiovascular negative cardio ROS Normal cardiovascular exam     Neuro/Psych Seizures -, Well Controlled,  negative psych ROS   GI/Hepatic Neg liver ROS, GERD  ,  Endo/Other  negative endocrine ROS  Renal/GU negative Renal ROS  negative genitourinary   Musculoskeletal negative musculoskeletal ROS (+)   Abdominal   Peds  Hematology negative hematology ROS (+)   Anesthesia Other Findings   Reproductive/Obstetrics (+) Pregnancy                             Anesthesia Physical Anesthesia Plan  ASA: 2  Anesthesia Plan: Epidural   Post-op Pain Management:    Induction:   PONV Risk Score and Plan:   Airway Management Planned: Natural Airway  Additional Equipment:   Intra-op Plan:   Post-operative Plan:   Informed Consent: I have reviewed the patients History and Physical, chart, labs and discussed the procedure including the risks, benefits and alternatives for the proposed anesthesia with the patient or authorized representative who has indicated his/her understanding and acceptance.       Plan Discussed with: Anesthesiologist  Anesthesia Plan Comments:         Anesthesia Quick Evaluation

## 2022-02-28 NOTE — MAU Note (Signed)
Patient arrived from home complaining of contractions that started at midnight with some pink discharge. Pt stated that the contractions are 6-10 mins apart. + Fm reported

## 2022-03-01 NOTE — Progress Notes (Signed)
Post Partum Day 1 Subjective: Heather Warner is doing well. She is up ad lib, voiding, tolerating PO, and + flatus  Objective: Blood pressure 115/78, pulse (!) 52, temperature 98 F (36.7 C), temperature source Oral, resp. rate 16, height 5\' 4"  (1.626 m), weight 68.1 kg, last menstrual period 05/26/2021, SpO2 100 %, unknown if currently breastfeeding.  Physical Exam:  General: alert, cooperative, and no distress Lochia: appropriate Uterine Fundus: firm Incision: n/a DVT Evaluation: No evidence of DVT seen on physical exam.  Recent Labs    02/28/22 1148  HGB 12.2  HCT 36.3    Assessment/Plan: Plan for discharge tomorrow Patient would like to continue to work with lactation today   LOS: 1 day   03/02/22, CNM 03/01/2022, 8:05 AM

## 2022-03-01 NOTE — Lactation Note (Signed)
This note was copied from a baby's chart. Lactation Consultation Note Birthing parent had baby on the breast when LC entered rm.  Newborn feeding habits, STS, I&O, support, positioning, body alignment, and behavior. Praised birthing parent for good latch.  Encouraged to feed baby 8-12 times/24 hours and with feeding cues.   Encouraged to call for assistance or questions.  Patient Name: Heather Warner [id number] Date: 03/01/2022 Reason for consult: Initial assessment;Primapara;Term Age:36 hours  Maternal Data Does the patient have breastfeeding experience prior to this delivery?: No  Feeding    LATCH Score Latch: Grasps breast easily, tongue down, lips flanged, rhythmical sucking.  Audible Swallowing: A few with stimulation  Type of Nipple: Everted at rest and after stimulation  Comfort (Breast/Nipple): Soft / non-tender  Hold (Positioning): Assistance needed to correctly position infant at breast and maintain latch.  LATCH Score: 8   Lactation Tools Discussed/Used    Interventions Interventions: Breast feeding basics reviewed;Skin to skin;Support pillows;LC Services brochure  Discharge    Consult Status Consult Status: Follow-up Date: 03/02/22 Follow-up type: In-patient    Charyl Dancer 03/01/2022, 6:19 AM

## 2022-03-01 NOTE — Anesthesia Postprocedure Evaluation (Signed)
Anesthesia Post Note  Patient: Heather Warner  Procedure(s) Performed: AN AD HOC LABOR EPIDURAL     Patient location during evaluation: Mother Baby Anesthesia Type: Epidural Level of consciousness: awake and alert Pain management: pain level controlled Vital Signs Assessment: post-procedure vital signs reviewed and stable Respiratory status: spontaneous breathing, nonlabored ventilation and respiratory function stable Cardiovascular status: stable Postop Assessment: no headache, no backache and epidural receding Anesthetic complications: no   No notable events documented.  Last Vitals:  Vitals:   03/01/22 0118 03/01/22 0502  BP: 131/83 115/78  Pulse: (!) 51 (!) 52  Resp:  16  Temp:  36.7 C  SpO2:  100%    Last Pain:  Vitals:   03/01/22 0735  TempSrc:   PainSc: 6    Pain Goal: Patients Stated Pain Goal: 2 (03/01/22 0502)              Epidural/Spinal Function Cutaneous sensation: Normal sensation (03/01/22 0735), Patient able to flex knees: Yes (03/01/22 0735), Patient able to lift hips off bed: Yes (03/01/22 0735), Back pain beyond tenderness at insertion site: No (03/01/22 0735), Progressively worsening motor and/or sensory loss: No (03/01/22 0735), Bowel and/or bladder incontinence post epidural: No (03/01/22 0735)  [name]

## 2022-03-01 NOTE — Social Work (Signed)
CSW received consult for hx of Anxiety and Depression.  CSW met with MOB to offer support and complete assessment.    CSW met with MOB at bedside and introduced CSW. CSW observed MOB holding the infant and FOB present at bedside. MOB presented calm welcomed CSW to share all information with FOB present. CSW inquired how MOB has felt since giving birth. MOB reported that she feels "sore but good." CSW inquired about the L&D. MOB expressed, " I don't want to do it again" and shared that when she arrived at the hospital, she was 8cm dilated. FOB politely interjected to praise MOB for her efforts. CSW inquired about MOB mental health history. MOB acknowledged that she has a history of depression and anxiety diagnosed at back in high school. MOB shared during the pregnancy she worked a lot and could not engage in her normal activities and felt this was an adjustment. MOB reported that she downloaded the "peanut app" platform which allowed her to relate to other mothers that may have been experiencing similar emotions. MOB expressed, " I related to them and did not feel I was going crazy." MOB reported that she participated in "centering sessions" through her OB which allowed her to talk with her mothers' at the clinic about their pregnancy experience. MOB reported they often text and check in with one another. MOB shared that she copes by praying and talking with her friends. She also saw a virtual therapist early in the pregnancy however she did not feel a "real connection" so did not disclose as much to therapist as she would have liked to and prefers to follow up with a therapist in person. MOB reported that she is relocating to from [place] to [place] and will be open to seeing a therapist. CSW provided MOB with a list of mental health therapist. CSW discussed PPD symptoms and provided resources.   CSW provided education regarding the baby blues period vs. perinatal mood disorders, discussed treatment and  gave resources for mental health follow up if concerns arise.  CSW recommended MOB complete a self-evaluation during the postpartum time period using the New Mom Checklist from Postpartum Progress and encouraged MOB to contact a medical professional if symptoms are noted at any time. MOB reported she feels comfortable reaching out to her provider if she has concerns. CSW assessed MOB for safety. MOB denied thoughts of harm to self and others. CSW asked about MOB supports. MOB identified FOB, family and friends as supports.   MOB reported that has essential item for the infant including a bassinet where the infant will sleep. CSW provided review of Sudden Infant Death Syndrome (SIDS) precautions. MOB has chosen [facility] for the infants' follow up care. CSW assessed MOB for additional needs. MOB reported no further need.   CSW identifies no further need for intervention and no barriers to discharge at this time.  Kathrin Greathouse, MSW, LCSW [facility] [employer] Worker  [phone] 03/01/2022  3:22 PM

## 2022-03-02 MED ORDER — IBUPROFEN 600 MG PO TABS: 600.0000 mg | ORAL_TABLET | Freq: Four times a day (QID) | ORAL | 0 refills | Status: AC

## 2022-03-02 MED ORDER — ACETAMINOPHEN 325 MG PO TABS: 650.0000 mg | ORAL_TABLET | ORAL | 0 refills | Status: AC | PRN

## 2022-03-02 NOTE — H&P (Signed)
CSW received call from the lactation consultant regarding cocaine use noted MOB active problem list. Per chart review, MOB active cocaine use was noted in 2019 and No hx of SUD  during this pregnancy. CSW discussed with RN Beverly and she was informed no SUD during this pregnancy.   Rocklin Soderquist, MSW, LCSW [facility]  Clinical Social Worker  [phone] 03/02/2022  2:01 PM  

## 2022-03-02 NOTE — Lactation Note (Signed)
This note was copied from a baby's chart. Lactation Consultation Note  Patient Name: Heather Warner [id number] Date: 03/02/2022 Reason for consult: Follow-up assessment;Primapara;Term Age:29 hours  Mom's milk is coming to volume. Mom with compression stripes on both nipples. Specifics of an asymmetric latch were shown via [employer].   Mom was assisted with latch; infant has an excellent suck:swallow ratio when at breast. Milk visible at breast while "[name]" was suckling. Mandible was lowered to improve Mom's comfort. Mom felt much better.  Mom knows how to reach [place] for post-discharge questions and is also aware of [name] [facility].   Maternal Data Does the patient have breastfeeding experience prior to this delivery?: No  Feeding Mother's Current Feeding Choice: Breast Milk  LATCH Score Latch: Grasps breast easily, tongue down, lips flanged, rhythmical sucking.  Audible Swallowing: Spontaneous and intermittent  Type of Nipple: Everted at rest and after stimulation  Comfort (Breast/Nipple): Soft / non-tender  Hold (Positioning): Assistance needed to correctly position infant at breast and maintain latch.  LATCH Score: 9   Interventions Interventions: Education;Assisted with latch;Comfort gels  Discharge Discharge Education: Engorgement and breast care Pump: Manual;Personal  Consult Status Consult Status: Complete    Remigio Eisenmenger 03/02/2022, 2:11 PM

## 2022-03-08 ENCOUNTER — Telehealth (HOSPITAL_COMMUNITY): Payer: Self-pay | Admitting: *Deleted

## 2022-03-08 NOTE — Telephone Encounter (Signed)
Left phone voicemail message.  Duffy Rhody, RN 03-08-2022 at 3:22pm

## 2022-03-09 ENCOUNTER — Encounter: Payer: Medicaid Other | Admitting: Family Medicine

## 2022-03-16 ENCOUNTER — Encounter: Payer: Medicaid Other | Admitting: Medical

## 2022-03-31 ENCOUNTER — Ambulatory Visit (INDEPENDENT_AMBULATORY_CARE_PROVIDER_SITE_OTHER): Payer: Medicaid Other | Admitting: Family Medicine

## 2022-03-31 ENCOUNTER — Encounter: Payer: Self-pay | Admitting: Family Medicine

## 2022-03-31 ENCOUNTER — Other Ambulatory Visit: Payer: Self-pay

## 2022-03-31 DIAGNOSIS — Z975 Presence of (intrauterine) contraceptive device: Secondary | ICD-10-CM | POA: Diagnosis not present

## 2022-03-31 NOTE — Progress Notes (Signed)
    Post Partum Visit Note  Heather Warner is a 31 y.o. G10P1011 female who presents for a postpartum visit. She is 4 weeks postpartum following a normal spontaneous vaginal delivery.  I have fully reviewed the prenatal and intrapartum course. The delivery was at [redacted]w[redacted]d gestational weeks.  Anesthesia: epidural. Postpartum course has been going well. Baby is doing well. Baby is feeding by breast. Bleeding staining only. Bowel function is normal. Bladder function is normal. Patient is not sexually active. Contraception method is IUD. Postpartum depression screening: negative.   The pregnancy intention screening data noted above was reviewed. Potential methods of contraception were discussed. The patient elected to proceed with No data recorded.    Health Maintenance Due  Topic Date Due   COVID-19 Vaccine (1) Never done   INFLUENZA VACCINE  03/02/2022    The following portions of the patient's history were reviewed and updated as appropriate: allergies, current medications, past family history, past medical history, past social history, past surgical history, and problem list.  Review of Systems Pertinent items are noted in HPI.  Objective:  LMP 05/26/2021    General:  alert, cooperative, and appears stated age   Breasts:  not indicated  Lungs: clear to auscultation bilaterally  Heart:  regular rate and rhythm, S1, S2 normal, no murmur, click, rub or gallop  Abdomen: soft, non-tender; bowel sounds normal; no masses,  no organomegaly   Wound NA  GU exam:  normal- trimmed strings       Assessment:    Normal postpartum exam.   Plan:   Essential components of care per ACOG recommendations:  1.  Mood and well being: Patient with negative depression screening today. Reviewed local resources for support.  - Patient tobacco use? No.   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 Infant care and feeding:  -Patient currently breastmilk feeding? Yes. Reviewed importance of draining  breast regularly to support lactation.  -Social determinants of health (SDOH) reviewed in EPIC. No concerns  3. Sexuality, contraception and birth spacing - Patient does not want a pregnancy in the next year.  Desired family size is 3 children.  - Reviewed reproductive life planning. Reviewed contraceptive methods based on pt preferences and effectiveness.  Patient desired IUD or IUS today.   - Discussed birth spacing of 18 months  4. Sleep and fatigue -Encouraged family/partner/community support of 4 hrs of uninterrupted sleep to help with mood and fatigue  5. Physical Recovery  - Discussed patients delivery and complications. She describes her labor as good. - Patient had a Vaginal, no problems at delivery. Patient had a  no  laceration. Perineal healing reviewed. Patient expressed understanding - Patient has urinary incontinence? No. - Patient is safe to resume physical and sexual activity  6.  Health Maintenance - HM due items addressed Yes - Last pap smear  Diagnosis  Date Value Ref Range Status  10/28/2021   Final   - Negative for intraepithelial lesion or malignancy (NILM)   Pap smear not done at today's visit.  -Breast Cancer screening indicated? No.   7. Chronic Disease/Pregnancy Condition follow up: None  - PCP follow up Return in about 1 week (around 04/07/2022) for Lactation.   Vidal Schwalbe, CMA Center for [employer], [facility]

## 2022-04-02 ENCOUNTER — Encounter: Payer: Self-pay | Admitting: Family Medicine

## 2022-04-02 DIAGNOSIS — Z975 Presence of (intrauterine) contraceptive device: Secondary | ICD-10-CM | POA: Insufficient documentation

## 2023-04-26 ENCOUNTER — Ambulatory Visit: Payer: Medicaid Other | Admitting: Obstetrics and Gynecology

## 2023-04-26 ENCOUNTER — Encounter: Payer: Self-pay | Admitting: Obstetrics and Gynecology

## 2023-04-26 VITALS — BP 111/78 | HR 91 | Wt 131.8 lb

## 2023-04-26 DIAGNOSIS — Z975 Presence of (intrauterine) contraceptive device: Secondary | ICD-10-CM | POA: Diagnosis not present

## 2023-04-26 DIAGNOSIS — Z1330 Encounter for screening examination for mental health and behavioral disorders, unspecified: Secondary | ICD-10-CM | POA: Diagnosis not present

## 2023-04-26 DIAGNOSIS — N941 Unspecified dyspareunia: Secondary | ICD-10-CM | POA: Diagnosis not present

## 2023-04-26 DIAGNOSIS — N930 Postcoital and contact bleeding: Secondary | ICD-10-CM | POA: Diagnosis not present

## 2023-04-26 MED ORDER — DOXYCYCLINE HYCLATE 100 MG PO CAPS: 100.0000 mg | ORAL_CAPSULE | Freq: Two times a day (BID) | ORAL | 0 refills | Status: AC

## 2023-04-26 NOTE — Progress Notes (Unsigned)
GYNECOLOGY VISIT  Patient name: Heather Warner MRN [id number]  Date of birth: 08-Oct-1990 Chief Complaint:   Gynecologic Exam   History:  Heather Warner is a 32 y.o. G2P1011 being seen today for IUD check.  Has not been having menses with IUD in place but has noted occasional pain with intercourse in certain positions as well as bleeding after intercourse. Having both since delivery. Will have more than spotting but not quite lcots when she has the spotting. The pain with intercourse is sharp and goes up the abdomen and will be sore for a while and then  No prior bleding with intercourse or pain with intercourse before baby  Past Medical History:  Diagnosis Date   Cocaine use 10/19/2021   2019    Epilepsy ([facility])     Past Surgical History:  Procedure Laterality Date   APPENDECTOMY  2005   WISDOM TOOTH EXTRACTION Left 2013    The following portions of the patient's history were reviewed and updated as appropriate: allergies, current medications, past family history, past medical history, past social history, past surgical history and problem list.   Health Maintenance:   Last pap     Component Value Date/Time   DIAGPAP  10/28/2021 1037    - Negative for intraepithelial lesion or malignancy (NILM)   HPVHIGH Negative 10/28/2021 1037   ADEQPAP  10/28/2021 1037    Satisfactory for evaluation; transformation zone component PRESENT.    High Risk HPV: Positive  Adequacy:  Satisfactory for evaluation, transformation zone component PRESENT  Diagnosis:  Atypical squamous cells of undetermined significance (ASC-US)  Last mammogram: N/A   Review of Systems:  Pertinent items are noted in HPI. Comprehensive review of systems was otherwise negative.   Objective:  Physical Exam BP 111/78   Pulse 91   Wt 131 lb 12.8 oz (59.8 kg)   BMI 22.62 kg/m    Physical Exam Vitals and nursing note reviewed. Exam conducted with a chaperone present.  Constitutional:      Appearance:  Normal appearance.  HENT:     Head: Normocephalic and atraumatic.  Pulmonary:     Effort: Pulmonary effort is normal.     Breath sounds: Normal breath sounds.  Abdominal:     Comments: IUD strings visualized Minimal uterine tendernes Tender left obturator internus  Genitourinary:    General: Normal vulva.     Exam position: Lithotomy position.     Vagina: Normal.     Cervix: Normal.  Skin:    General: Skin is warm and dry.  Neurological:     General: No focal deficit present.     Mental Status: She is alert.  Psychiatric:        Mood and Affect: Mood normal.        Behavior: Behavior normal.        Thought Content: Thought content normal.        Judgment: Judgment normal.        Assessment & Plan:   1. Postcoital bleeding Ordered for pelvic US to assess IUD position and if possible structural component. Discussed possible endometritis, will treat presumptively with doxycycline.  - US PELVIC COMPLETE WITH TRANSVAGINAL; Future - doxycycline (VIBRAMYCIN) 100 MG capsule; Take 1 capsule (100 mg total) by mouth 2 (two) times daily for 10 days.  Dispense: 20 capsule; Refill: 0  2. IUD (intrauterine device) in place Strings visualized, pelvic US to confirm position - US PELVIC COMPLETE WITH TRANSVAGINAL; Future  3. Dyspareunia, female Noted to have  pelvic myalgia on exam. Referral to PFPT to address.  - Ambulatory referral to Physical Therapy   Routine preventative health maintenance measures emphasized.  Lorriane Shire, MD Minimally Invasive Gynecologic Surgery Center for [facility], [facility]

## 2023-05-06 ENCOUNTER — Ambulatory Visit (HOSPITAL_COMMUNITY): Payer: Medicaid Other
